# Patient Record
Sex: Female | Born: 1955 | Race: White | Hispanic: Yes | Smoking: Never smoker
Health system: Southern US, Community
[De-identification: ages and names within clinical notes are randomized; demographics above are authoritative.]

## PROBLEM LIST (undated history)

## (undated) DIAGNOSIS — F419 Anxiety disorder, unspecified: Secondary | ICD-10-CM

## (undated) DIAGNOSIS — H269 Unspecified cataract: Secondary | ICD-10-CM

## (undated) DIAGNOSIS — I10 Essential (primary) hypertension: Secondary | ICD-10-CM

## (undated) DIAGNOSIS — M1990 Unspecified osteoarthritis, unspecified site: Secondary | ICD-10-CM

## (undated) DIAGNOSIS — F329 Major depressive disorder, single episode, unspecified: Secondary | ICD-10-CM

## (undated) DIAGNOSIS — E785 Hyperlipidemia, unspecified: Secondary | ICD-10-CM

## (undated) DIAGNOSIS — F32A Depression, unspecified: Secondary | ICD-10-CM

## (undated) DIAGNOSIS — K219 Gastro-esophageal reflux disease without esophagitis: Secondary | ICD-10-CM

## (undated) DIAGNOSIS — G4730 Sleep apnea, unspecified: Secondary | ICD-10-CM

## (undated) DIAGNOSIS — IMO0002 Reserved for concepts with insufficient information to code with codable children: Secondary | ICD-10-CM

## (undated) HISTORY — DX: Unspecified osteoarthritis, unspecified site: M19.90

## (undated) HISTORY — DX: Reserved for concepts with insufficient information to code with codable children: IMO0002

## (undated) HISTORY — DX: Hyperlipidemia, unspecified: E78.5

## (undated) HISTORY — PX: ABLATION SAPHENOUS VEIN W/ RFA: SUR11

## (undated) HISTORY — DX: Major depressive disorder, single episode, unspecified: F32.9

## (undated) HISTORY — DX: Sleep apnea, unspecified: G47.30

## (undated) HISTORY — DX: Unspecified cataract: H26.9

## (undated) HISTORY — DX: Anxiety disorder, unspecified: F41.9

## (undated) HISTORY — DX: Depression, unspecified: F32.A

## (undated) HISTORY — PX: COLONOSCOPY: SHX174

## (undated) HISTORY — DX: Essential (primary) hypertension: I10

## (undated) HISTORY — PX: JOINT REPLACEMENT: SHX530

## (undated) HISTORY — DX: Gastro-esophageal reflux disease without esophagitis: K21.9

## (undated) HISTORY — DX: Morbid (severe) obesity due to excess calories: E66.01

---

## 2006-03-23 ENCOUNTER — Ambulatory Visit: Payer: Self-pay | Admitting: Family Medicine

## 2006-05-08 ENCOUNTER — Ambulatory Visit: Payer: Self-pay | Admitting: Internal Medicine

## 2006-05-09 ENCOUNTER — Ambulatory Visit (HOSPITAL_COMMUNITY): Admission: RE | Admit: 2006-05-09 | Discharge: 2006-05-09 | Payer: Self-pay | Admitting: Internal Medicine

## 2006-06-01 ENCOUNTER — Ambulatory Visit: Payer: Self-pay | Admitting: Internal Medicine

## 2006-06-22 ENCOUNTER — Ambulatory Visit: Payer: Self-pay | Admitting: Internal Medicine

## 2007-02-27 ENCOUNTER — Ambulatory Visit: Payer: Self-pay | Admitting: Internal Medicine

## 2007-10-20 ENCOUNTER — Emergency Department (HOSPITAL_COMMUNITY): Admission: EM | Admit: 2007-10-20 | Discharge: 2007-10-20 | Payer: Self-pay | Admitting: Emergency Medicine

## 2008-02-06 ENCOUNTER — Ambulatory Visit: Payer: Self-pay | Admitting: Internal Medicine

## 2008-11-23 ENCOUNTER — Observation Stay (HOSPITAL_COMMUNITY): Admission: EM | Admit: 2008-11-23 | Discharge: 2008-11-23 | Payer: Self-pay | Admitting: Emergency Medicine

## 2008-11-23 ENCOUNTER — Ambulatory Visit: Payer: Self-pay | Admitting: Vascular Surgery

## 2008-11-23 ENCOUNTER — Encounter (INDEPENDENT_AMBULATORY_CARE_PROVIDER_SITE_OTHER): Payer: Self-pay | Admitting: Emergency Medicine

## 2009-06-16 ENCOUNTER — Ambulatory Visit: Payer: Self-pay | Admitting: Vascular Surgery

## 2009-07-07 ENCOUNTER — Ambulatory Visit: Payer: Self-pay | Admitting: Vascular Surgery

## 2009-07-28 ENCOUNTER — Ambulatory Visit: Payer: Self-pay | Admitting: Vascular Surgery

## 2009-11-18 ENCOUNTER — Emergency Department (HOSPITAL_COMMUNITY): Admission: EM | Admit: 2009-11-18 | Discharge: 2009-11-18 | Payer: Self-pay | Admitting: Family Medicine

## 2010-07-26 ENCOUNTER — Encounter: Payer: Self-pay | Admitting: Internal Medicine

## 2010-08-02 ENCOUNTER — Encounter: Payer: Self-pay | Admitting: Internal Medicine

## 2010-08-03 ENCOUNTER — Ambulatory Visit (INDEPENDENT_AMBULATORY_CARE_PROVIDER_SITE_OTHER): Payer: Medicaid Other | Admitting: Internal Medicine

## 2010-08-03 ENCOUNTER — Encounter: Payer: Self-pay | Admitting: Internal Medicine

## 2010-08-03 ENCOUNTER — Encounter: Payer: Self-pay | Admitting: *Deleted

## 2010-08-03 DIAGNOSIS — G4730 Sleep apnea, unspecified: Secondary | ICD-10-CM | POA: Insufficient documentation

## 2010-08-03 DIAGNOSIS — G4733 Obstructive sleep apnea (adult) (pediatric): Secondary | ICD-10-CM | POA: Insufficient documentation

## 2010-08-03 DIAGNOSIS — R609 Edema, unspecified: Secondary | ICD-10-CM | POA: Insufficient documentation

## 2010-08-03 DIAGNOSIS — R9389 Abnormal findings on diagnostic imaging of other specified body structures: Secondary | ICD-10-CM

## 2010-08-03 DIAGNOSIS — R931 Abnormal findings on diagnostic imaging of heart and coronary circulation: Secondary | ICD-10-CM

## 2010-08-03 NOTE — Assessment & Plan Note (Signed)
The patient was referred because of mild pulmonary hypertension. There is no significant evidence of right ventricular strain a physical examination. There is left ventricular hypertrophy. She also has evidence of sleep disordered breathing and headaches and daytime somnolence. I suspect she has obstructive sleep apnea and she would benefit from a sleep study.

## 2010-08-03 NOTE — Assessment & Plan Note (Signed)
She has hypertension sleep disorder breathing left ventricular hypertrophy and headaches. It is certainly highly likely such with her body habitus that she has obstructive sleep apnea and sleep test would be appropriate.

## 2010-08-03 NOTE — Progress Notes (Signed)
HPI:[name] is seen at the request of Dr. Sedalia Muta because of an abnormal echo demonstrating mild pulmonary hypertension. The echo also revealed a large left ventricular dimension.  She has a history of chronic dyspnea on exertion and some lower extremity and upper extremity swelling. She went to see her primary physician because of "swelling in her head". The echo was obtained the aforementioned results noted in the referral made. There has been no change in her dyspnea. She denies chest discomfort. She does not have hypertension or diabetes.   .  There has been no syncope, But there has been some orthostatic intolerance. She has not had any palpitations.  She underwent echo that demonstrated mild pulmonary hypertension with an estimated pressure of 40. Also noted to have a large left ventricular end-diastolic dimension of 72; no regurgitant lesions are noted.   She has been taking meloxicame for about one year.There has been more swelling since having been initiated on gabapentin  Current Outpatient Prescriptions  Medication Sig Dispense Refill  . cyclobenzaprine (FLEXERIL) 10 MG tablet Take 10 mg by mouth 3 (three) times daily as needed.        . DULoxetine (CYMBALTA) 30 MG capsule Take 60 mg by mouth daily.        Marland Kitchen gabapentin (NEURONTIN) 300 MG capsule Take 900 mg by mouth at bedtime.        . meloxicam (MOBIC) 15 MG tablet Take 15 mg by mouth daily.        Marland Kitchen oxyCODONE-acetaminophen (PERCOCET) 5-325 MG per tablet Take 1 tablet by mouth 2 (two) times daily as needed.          No Known Allergies  Past Medical History  Diagnosis Date  . Hypertension   . Anxiety   . Depression   . Degenerative disc disease     No past surgical history on file.  Family History  Problem Relation Age of Onset  . Diabetes Mother   . Cancer Father     History   Social History  . Marital Status: Legally Separated    Spouse Name: N/A    Number of Children: 2  . Years of Education: N/A    Occupational History  . homemaker    Social History Main Topics  . Smoking status: Never Smoker   . Smokeless tobacco: Never Used  . Alcohol Use: Yes  . Drug Use: No  . Sexually Active: Not on file   Other Topics Concern  . Not on file   Social History Narrative  . No narrative on file    Fourteen point review of systems was negative except as noted in HPI and PMH   PHYSICAL EXAMINATION  There were no vitals taken for this visit.   Well developed and nourished Middle-aged Hispanic female moderately obese appearing her stated age in no acute distress HENT normal Neck supple with JVP-flat Carotids brisk and full without bruits Back without kyphosis and scoliosis Clear Regular rate and rhythm, no murmurs or gallops; no RV heave Abd-soft with active BS without hepatomegaly or midline pulsation Femoral pulses 2+ distal pulses intact No Clubbing cyanosis edema Skin-warm and dry LN-neg submandibular and supraclavicular A & Oriented CN 3-12 normal  Grossly normal sensory and motor function Affect sad and flat.  Elective cardiogram dated today demonstrates sinus rhythm at 75 Intervals 0.17 /.09/0.37 Axis is 11 Otherwise normal

## 2010-08-03 NOTE — Assessment & Plan Note (Signed)
There is no significant pitting edema. I don't understand the idea of edema of the scalp arms and lower extremities apart from the issue of hypoalbuminemia and possibly urine loss of protein. I assume that this has been evaluated by Dr. Sedalia Muta

## 2010-08-05 ENCOUNTER — Telehealth: Payer: Self-pay | Admitting: *Deleted

## 2010-08-07 LAB — POCT URINALYSIS DIP (DEVICE)
Bilirubin Urine: NEGATIVE
Glucose, UA: NEGATIVE mg/dL
Ketones, ur: NEGATIVE mg/dL
Protein, ur: NEGATIVE mg/dL
Specific Gravity, Urine: 1.02 (ref 1.005–1.030)

## 2010-08-09 NOTE — Consult Note (Signed)
Summary: Consultation Report  Consultation Report   Imported By: Earl Many 08/02/2010 18:41:27  _____________________________________________________________________  External Attachment:    Type:   Image     Comment:   External Document

## 2010-08-09 NOTE — Progress Notes (Signed)
  Phone Note Outgoing Call   Call placed by: Scherrie Bateman, LPN,  August 05, 2010 3:17 PM Call placed to: Patient Summary of Call: PT DROPPED ECHO CD  AND HARD COPY AS WELL OFF  AT OFFICE TODAY  .LEFT WORD THAT PT MAY PICK TEST REPORTS WILL LEAVE AT FRONT DESK COPY MADE OF ECHO EPORT TO BE SCANNED INTO PT'S RECORDS  Initial call taken by: Scherrie Bateman, LPN,  August 05, 2010 3:19 PM

## 2010-08-09 NOTE — Assessment & Plan Note (Signed)
Summary: consult : abn ekg. per [facility] [phone]. [email or web address]   Visit Type:  Initial Consult Primary Provider:  Gerre Pebbles, MD   History of Present Illness: see epic  Current Medications (verified): 1)  Cymbalta 30 Mg Cpep (Duloxetine Hcl) .... 2 Caps Once Daily 2)  Meloxicam 15 Mg Tabs (Meloxicam) .... Daily 3)  Gabapentin 300 Mg Caps (Gabapentin) .... 3 At Bedtime 4)  Cyclobenzaprine Hcl 10 Mg Tabs (Cyclobenzaprine Hcl) .... As Needed 5)  Oxycodone-Acetaminophen 5-325 Mg Tabs (Oxycodone-Acetaminophen) .Marland Kitchen.. 1 Tab Two Times A Day  Allergies (verified): No Known Drug Allergies  Past History:  Past Medical History: HYPERLIPIDEMIA DEPRESSION   ECHOCARDIOGRAM, ABNORMAL (ICD-793.2)    Vital Signs:  Patient profile:   55 year old female Height:      68 inches Weight:      288 pounds BMI:     43.95 Pulse rate:   75 / minute BP sitting:   118 / 78  (left arm)  Vitals Entered By: Laurance Flatten CMA (August 03, 2010 8:54 AM)   Other Orders: EKG w/ Interpretation (93000)  Patient Instructions: 1)  Your physician recommends that you schedule a follow-up appointment in: as needed.

## 2010-08-09 NOTE — Procedures (Signed)
Summary: [facility]: Cardiopulmonary [facility]: Cardiopulmonary Services   Imported By: Earl Many 08/02/2010 18:39:56  _____________________________________________________________________  External Attachment:    Type:   Image     Comment:   External Document

## 2010-08-15 ENCOUNTER — Telehealth: Payer: Self-pay | Admitting: Internal Medicine

## 2010-08-15 DIAGNOSIS — R9389 Abnormal findings on diagnostic imaging of other specified body structures: Secondary | ICD-10-CM

## 2010-08-15 NOTE — Telephone Encounter (Signed)
DAUGHTER AWARE WILL DISCUSS  WITH DR Graciela Husbands IF PT NEEDS  TO HAVE A REPEAT ECHO  DUE TO ECHO AT [place] READ AS ABN.

## 2010-08-15 NOTE — Telephone Encounter (Signed)
Pt daughter states they left paper of test for the doctor to review. Pt states they have not heard from the doctor and it has been over a week

## 2010-08-16 NOTE — Telephone Encounter (Signed)
Pt's daughter aware per dr Graciela Husbands repeat echo .Echo scheduled for 08/30/10 at 7:30 am .

## 2010-08-16 NOTE — Telephone Encounter (Signed)
probobaly just best to repeat the echo and make sure it s okay

## 2010-08-24 ENCOUNTER — Encounter: Payer: Self-pay | Admitting: Internal Medicine

## 2010-08-28 LAB — POCT I-STAT, CHEM 8
Chloride: 104 mEq/L (ref 96–112)
Potassium: 3.9 mEq/L (ref 3.5–5.1)
Sodium: 141 mEq/L (ref 135–145)
TCO2: 24 mmol/L (ref 0–100)

## 2010-08-28 LAB — CBC
MCV: 90.9 fL (ref 78.0–100.0)
Platelets: 175 10*3/uL (ref 150–400)
RBC: 4.09 MIL/uL (ref 3.87–5.11)
RDW: 14 % (ref 11.5–15.5)
WBC: 6.8 10*3/uL (ref 4.0–10.5)

## 2010-08-28 LAB — DIFFERENTIAL
Basophils Absolute: 0.1 10*3/uL (ref 0.0–0.1)
Eosinophils Absolute: 0.2 10*3/uL (ref 0.0–0.7)
Neutro Abs: 3.6 10*3/uL (ref 1.7–7.7)

## 2010-08-28 LAB — D-DIMER, QUANTITATIVE: D-Dimer, Quant: 1.53 ug/mL-FEU — ABNORMAL HIGH (ref 0.00–0.48)

## 2010-08-30 ENCOUNTER — Ambulatory Visit (HOSPITAL_COMMUNITY): Payer: Medicaid Other | Attending: Internal Medicine

## 2010-08-30 DIAGNOSIS — I379 Nonrheumatic pulmonary valve disorder, unspecified: Secondary | ICD-10-CM | POA: Insufficient documentation

## 2010-08-30 DIAGNOSIS — I10 Essential (primary) hypertension: Secondary | ICD-10-CM | POA: Insufficient documentation

## 2010-08-30 DIAGNOSIS — I059 Rheumatic mitral valve disease, unspecified: Secondary | ICD-10-CM | POA: Insufficient documentation

## 2010-08-30 DIAGNOSIS — R9389 Abnormal findings on diagnostic imaging of other specified body structures: Secondary | ICD-10-CM | POA: Insufficient documentation

## 2010-08-30 DIAGNOSIS — I079 Rheumatic tricuspid valve disease, unspecified: Secondary | ICD-10-CM | POA: Insufficient documentation

## 2010-09-09 ENCOUNTER — Other Ambulatory Visit: Payer: Self-pay | Admitting: Neurological Surgery

## 2010-09-09 DIAGNOSIS — M545 Low back pain: Secondary | ICD-10-CM

## 2010-09-14 ENCOUNTER — Encounter: Payer: Self-pay | Admitting: Internal Medicine

## 2010-09-14 ENCOUNTER — Ambulatory Visit
Admission: RE | Admit: 2010-09-14 | Discharge: 2010-09-14 | Disposition: A | Discharge status: Home / Self Care | Payer: Medicaid Other | Source: Ambulatory Visit | Attending: Neurological Surgery | Admitting: Neurological Surgery

## 2010-09-14 DIAGNOSIS — M545 Low back pain: Secondary | ICD-10-CM

## 2010-09-19 ENCOUNTER — Encounter: Payer: Self-pay | Admitting: Internal Medicine

## 2010-10-04 ENCOUNTER — Ambulatory Visit: Payer: Medicaid Other | Admitting: Rehabilitative and Restorative Service Providers"

## 2010-10-04 NOTE — Consult Note (Signed)
NEW PATIENT CONSULTATION   Linda Acevedo, Linda Acevedo  DOB:  [birth year]                                       06/16/2009  CHART#:[id number]   The patient presents today for evaluation of lower extremity venous  pathology.  She is a very pleasant 55 year old Hispanic female with a  longstanding history of venous varicosities.  She reports that she had  bilateral great saphenous vein stripping and tributary phlebectomy  greater than 20 years.  She had a relatively early recurrence of  tributary varicosities.  Over the past several months starting back in  July 2010 she had multiple episodes of superficial thrombophlebitis  involving the calves and thighs bilaterally.  She was not admitted to  the hospital but was seen in the emergency department and kept for  observation and repeated ultrasound studies.  She did not have a history  of DVT during  these occasions.  She continues to have discomfort  related to this.  She has been in graduated compression garments since  July and has continued to have persistent pain over the tributary  varicosities and also has had recurrent episodes of superficial  thrombophlebitis as well.  She is quite concerned regarding the  potential for DVT.   PAST MEDICAL HISTORY:  Significant for depression and anxiety.  She also  has history of osteoarthritis with shoulder and bilateral lower  extremity discomfort related to this as well.  She speaks very poor  English but is here today with her daughter who is fluent in [place] and  [place] and provides most of the details.  She does not have history of  diabetes, hypertension or prior cardiac difficulty.  She denies any  family history or premature atherosclerotic disease.   SOCIAL HISTORY:  She has 2 children.  She does not smoke or drink  alcohol.   REVIEW OF SYSTEMS:  Her weight is reported at 270 pounds.  She is 5 feet  11 inches tall.  She denies cardiac, pulmonary, GI or GU difficulty.  She does report pain in her legs with walking and lying flat.  NEUROLOGIC:  Positive for dizziness.  MUSCULOSKELETAL:  Positive for arthritis and joint pain.  HEENT:  Negative.  HEMATOLOGIC:  Negative.  SKIN:  Negative.  PSYCHIATRIC:  Positive for depression and anxiety.   PHYSICAL EXAMINATION:  A well-developed, moderately obese female in no  acute distress.  Her heart rate is 80, respirations 16, blood pressure  is 118/80.  HEENT:  Normal.  Neck:  No adenopathy.  No JVD.  Her radial  and dorsalis pedis pulses are 2+ bilaterally.  Musculoskeletal:  No  major deformities or cyanosis.  Neurologic:  Negative for weakness.  She  has no ulcers or rashes.  She does have diffuse telangiectasia in the  bilateral lower extremities and does have scars from prior ankle  incisions and also phlebectomy sites in her calf.  She does have a  moderate amount of recurrent tributary varicosities in her calves and  popliteal spaces bilaterally.   Her duplex shows surgical absence of her great saphenous veins  bilaterally.  She does have a great deal of tortuous recurrence of  varicosities in her calves and thighs bilaterally.  I discussed options  with the patient.  I explained that should not put her at any  significant risk for DVT and that if she does  have recurrent episodes of  superficial thrombophlebitis, this would be treated with time and  ibuprofen.  She reports that she is having significant pain over the  varicosities despite compression garment use.  I  feel that she should  have a reasonable response with tributary phlebectomy.  I explained that  this should also reduce her risk for recurrent thrombophlebitis since  these veins will be surgically absent.  She understands and wishes to  proceed as soon as possible.     Larina Earthly, M.D.  Electronically Signed   TFE/MEDQ  D:  06/16/2009  T:  06/17/2009  Job:  [id number]   cc:   Fidela Juneau, PA-C

## 2010-10-04 NOTE — Assessment & Plan Note (Signed)
OFFICE VISIT   Linda Acevedo, Linda Acevedo  DOB:  1956-03-10                                       07/28/2009  CHART#:[id number]   The patient presents today for followup of her stab phlebectomy of her  bilateral superficial varicosities.  This was on 07/07/2009.  She has  done quite well.  She is here today with her daughter for translation.  She has had no complications and has minimal discomfort.  Her stab  incisions are all healing quite nicely and she has the usual amount of  thickness below these and I explained that this will continue to resolve  with time.  She will see [place] again on an as-needed basis.     Larina Earthly, M.D.  Electronically Signed   TFE/MEDQ  D:  07/28/2009  T:  07/29/2009  Job:  [id number]

## 2010-10-04 NOTE — Procedures (Signed)
LOWER EXTREMITY VENOUS REFLUX EXAM   INDICATION:  Bilateral lower extremity varicose veins with pain and  swelling with a history of venous procedures bilaterally over 20 years  ago.   EXAM:  Using color-flow imaging and pulse Doppler spectral analysis, the  right and left common femoral, superficial femoral, popliteal, posterior  tibial, greater and lesser saphenous veins are evaluated.  There is  evidence suggesting deep venous insufficiency in the right and left  lower extremities.   The right and left greater saphenous veins appear to have been stripped.   The right and left short saphenous veins appear to have had procedures  done to them as well.   GSV Diameter (used if found to be incompetent only)                                            Right    Left  Proximal Greater Saphenous Vein           cm       cm  Proximal-to-mid-thigh                     cm       cm  Mid thigh                                 cm       cm  Mid-distal thigh                          cm       cm  Distal thigh                              cm       cm  Knee                                      cm       cm   IMPRESSION:  1. Right and left greater saphenous veins appear to have been      stripped.  2. The deep venous system is not competent with reflux of >500      milliseconds.  3. The right and left lesser saphenous veins appear to have had      procedures to them as well.  4. Bilateral tortuous varicose veins medial / posterior throughout      lower extremities with superficial venous thrombosis near the      knees.   ___________________________________________  Larina Earthly, M.D.   AS/MEDQ  D:  06/16/2009  T:  06/16/2009  Job:  [id number]

## 2010-10-04 NOTE — Assessment & Plan Note (Signed)
OFFICE VISIT   ATALAYA, ZAPPIA  DOB:  Jun 10, 1955                                       07/07/2009  CHART#:[id number]   The patient underwent uneventful bilateral stab phlebectomy of multiple  tributary varicosities on the posterior calves and distal thighs  bilaterally under tumescent anesthesia.  She had no immediate  complications, was placed in a compression dressing and will be seen  again in 2 weeks for followup.     Larina Earthly, M.D.  Electronically Signed   TFE/MEDQ  D:  07/07/2009  T:  07/08/2009  Job:  [id number]

## 2010-10-26 ENCOUNTER — Ambulatory Visit: Payer: Medicaid Other | Attending: Neurological Surgery | Admitting: Physical Therapy

## 2010-10-26 DIAGNOSIS — IMO0001 Reserved for inherently not codable concepts without codable children: Secondary | ICD-10-CM | POA: Insufficient documentation

## 2010-10-26 DIAGNOSIS — M545 Low back pain, unspecified: Secondary | ICD-10-CM | POA: Insufficient documentation

## 2010-10-26 DIAGNOSIS — R5381 Other malaise: Secondary | ICD-10-CM | POA: Insufficient documentation

## 2010-10-26 DIAGNOSIS — M25659 Stiffness of unspecified hip, not elsewhere classified: Secondary | ICD-10-CM | POA: Insufficient documentation

## 2010-10-26 DIAGNOSIS — R293 Abnormal posture: Secondary | ICD-10-CM | POA: Insufficient documentation

## 2010-11-02 ENCOUNTER — Encounter: Payer: Self-pay | Admitting: Internal Medicine

## 2010-11-08 ENCOUNTER — Ambulatory Visit: Payer: Medicaid Other | Admitting: Physical Therapy

## 2010-11-15 ENCOUNTER — Ambulatory Visit: Payer: Medicaid Other | Admitting: Rehabilitation

## 2011-02-15 LAB — POCT I-STAT, CHEM 8
BUN: 11
Calcium, Ion: 1.16
Chloride: 107
Creatinine, Ser: 0.8
Glucose, Bld: 121 — ABNORMAL HIGH
Potassium: 6.3
Sodium: 139
TCO2: 29

## 2011-02-15 LAB — POTASSIUM: Potassium: 3.9

## 2017-02-13 DIAGNOSIS — I2699 Other pulmonary embolism without acute cor pulmonale: Secondary | ICD-10-CM | POA: Insufficient documentation

## 2017-02-27 ENCOUNTER — Other Ambulatory Visit: Payer: Self-pay | Admitting: *Deleted

## 2017-03-20 ENCOUNTER — Encounter: Payer: Self-pay | Admitting: Thoracic Surgery (Cardiothoracic Vascular Surgery)

## 2017-03-20 ENCOUNTER — Institutional Professional Consult (permissible substitution) (INDEPENDENT_AMBULATORY_CARE_PROVIDER_SITE_OTHER): Payer: Medicaid Other | Admitting: Thoracic Surgery (Cardiothoracic Vascular Surgery)

## 2017-03-20 VITALS — BP 119/84 | HR 68 | Ht 66.0 in | Wt 266.0 lb

## 2017-03-20 DIAGNOSIS — I77810 Thoracic aortic ectasia: Secondary | ICD-10-CM

## 2017-03-20 DIAGNOSIS — M179 Osteoarthritis of knee, unspecified: Secondary | ICD-10-CM | POA: Insufficient documentation

## 2017-03-20 DIAGNOSIS — Z6841 Body Mass Index (BMI) 40.0 and over, adult: Secondary | ICD-10-CM | POA: Insufficient documentation

## 2017-03-20 DIAGNOSIS — M1710 Unilateral primary osteoarthritis, unspecified knee: Secondary | ICD-10-CM | POA: Insufficient documentation

## 2017-03-20 NOTE — Progress Notes (Signed)
PCP is No primary care provider on file. Referring Provider is Adron Bene, PA-C  Chief Complaint  Patient presents with  . New Patient (Initial Visit)    thoracic aortic aneurysm, 4.0, CT Chest    HPI: [name] is sent for consultation regarding a 4 cm ascending aorta  [name] is a 61 year old woman with a history of hypertension, degenerative joint disease, anxiety and depression.  She also is morbidly obese.  She had a knee replacement back in June.  She says she never really felt right after that.  She was having chest pain and shortness of breath and went to see her primary Adron Bene in September.  She was sent to the hospital and had a CT angiogram which showed multiple right upper lobe pulmonary emboli and she was started on apixaban.  Also noted on CT was a 4.0 cm ascending aorta.  She has no family history of aneurysmal disease.  Past Medical History:  Diagnosis Date  . Anxiety   . Degenerative disc disease   . Depression   . Hypertension     Past Surgical History:  Procedure Laterality Date  . ABLATION SAPHENOUS VEIN W/ RFA    . JOINT REPLACEMENT     knee    Family History  Problem Relation Age of Onset  . Diabetes Mother   . Cancer Father     Social History Social History  Substance Use Topics  . Smoking status: Never Smoker  . Smokeless tobacco: Never Used  . Alcohol use Yes    Current Outpatient Prescriptions  Medication Sig Dispense Refill  . apixaban (ELIQUIS) 5 MG TABS tablet Take 5 mg by mouth 2 (two) times daily.    . DULoxetine (CYMBALTA) 30 MG capsule Take 60 mg by mouth daily.      Marland Kitchen oxyCODONE-acetaminophen (PERCOCET) 5-325 MG per tablet Take 1 tablet by mouth 2 (two) times daily as needed.       No current facility-administered medications for this visit.     Allergies  Allergen Reactions  . Gabapentin Swelling    Face swelling    Review of Systems  Constitutional: Positive for fatigue. Negative for activity  change.  HENT: Negative for trouble swallowing and voice change.   Eyes: Negative for visual disturbance.  Respiratory: Positive for shortness of breath.   Cardiovascular: Positive for chest pain and leg swelling (Varicose veins).  Gastrointestinal: Negative for abdominal distention and abdominal pain.  Genitourinary: Negative for dysuria and hematuria.  Musculoskeletal: Positive for arthralgias, back pain and gait problem.  Neurological: Negative for syncope and weakness.  Hematological: Negative for adenopathy. Does not bruise/bleed easily.  Psychiatric/Behavioral: Positive for dysphoric mood. The patient is nervous/anxious.   All other systems reviewed and are negative.   BP 119/84   Pulse 68   Ht 5\' 6"  (1.676 m)   Wt 266 lb (120.7 kg)   SpO2 99%   BMI 42.93 kg/m  Physical Exam  Constitutional: She is oriented to person, place, and time.  Morbidly obese  HENT:  Head: Normocephalic and atraumatic.  Mouth/Throat: No oropharyngeal exudate.  Eyes: Conjunctivae and EOM are normal. No scleral icterus.  Neck: Neck supple. No thyromegaly present.  No carotid bruits  Cardiovascular: Normal rate, regular rhythm and normal heart sounds.   No murmur heard. Pulmonary/Chest: Effort normal and breath sounds normal. No respiratory distress. She has no wheezes. She has no rales.  Abdominal: Soft. She exhibits no distension. There is no tenderness.  Musculoskeletal: She  exhibits edema.  Lymphadenopathy:    She has no cervical adenopathy.  Neurological: She is alert and oriented to person, place, and time. No cranial nerve deficit. She exhibits normal muscle tone.  Skin: Skin is warm and dry.  Vitals reviewed.    Diagnostic Tests: I personally reviewed the CT chest images from 02/13/2017.  I concur with the official impression which is dictated below.  Cardiovascular: There are multiple lower lobe pulmonary emboli bilaterally somewhat more on the right than the left.  Main pulmonary  artery embolus is evident.  There is pulmonary embolus in several branches to the right upper lobe pulmonary artery.  The right ventricle to left ventricle diameter ratio is less than 0.9, not consistent with right heart strain. The ascending thoracic aorta diameter is 4.0 x 4.0 cm.  There is no aortic dissection.  Visualized great vessels appear unremarkable.  Pericardium is not appreciably thickened. The main pulmonary outflow tract measures 3.4 cm, finding felt to be indicative of a degree of underlying pulmonary arterial hypertension. Mediastinum/nodes: thyroid appears normal.  There is no appreciable thoracic adenopathy.  No esophageal lesions are evident Lungs/pleura: There is patchy atelectasis in both lower lobes.  There is no edema or consolidation.  There is no appreciable pleural effusion or pleural thickening.  Impression: [name] is a 61 year old woman who was recently found to have a 4 cm ascending aorta on the CT angiogram that was done to evaluate for pulmonary embolus.  Pulmonary embolus-2 months post knee replacement.  She is being treated with apixaban and her symptoms have improved since she started treatment.  Ascending aorta-mildly dilated.  No indication for surgery.  Indications for surgery with the diameter of 5.5 cm or an increase in size of 5 mm and a 6-month period.  She is a large woman and this is just slightly above the upper limits of normal given her age and size.  She needs annual follow-up  Blood pressure-blood pressure control is the mainstay of treatment.  Her blood pressure was normal today.  She says that she has a blood pressure cuff at home.  I recommend that she check herself at least monthly with a target systolic pressure of less than 130.  Plan: Return in 1 year with CT angiogram of chest  Melrose Nakayama, MD [facility] [phone]

## 2017-08-08 ENCOUNTER — Encounter: Payer: Self-pay | Admitting: Gastroenterology

## 2018-01-30 ENCOUNTER — Other Ambulatory Visit: Payer: Self-pay | Admitting: Thoracic Surgery (Cardiothoracic Vascular Surgery)

## 2018-01-30 DIAGNOSIS — I712 Thoracic aortic aneurysm, without rupture, unspecified: Secondary | ICD-10-CM

## 2018-03-19 ENCOUNTER — Other Ambulatory Visit: Payer: Self-pay

## 2018-03-19 ENCOUNTER — Ambulatory Visit: Payer: Medicaid Other | Admitting: Thoracic Surgery (Cardiothoracic Vascular Surgery)

## 2018-03-19 ENCOUNTER — Encounter: Payer: Self-pay | Admitting: Thoracic Surgery (Cardiothoracic Vascular Surgery)

## 2018-03-19 ENCOUNTER — Ambulatory Visit
Admission: RE | Admit: 2018-03-19 | Discharge: 2018-03-19 | Disposition: A | Discharge status: Home / Self Care | Payer: Medicaid Other | Source: Ambulatory Visit | Attending: Thoracic Surgery (Cardiothoracic Vascular Surgery) | Admitting: Thoracic Surgery (Cardiothoracic Vascular Surgery)

## 2018-03-19 VITALS — BP 115/79 | HR 72 | Resp 16 | Ht 66.0 in | Wt 273.6 lb

## 2018-03-19 DIAGNOSIS — I77810 Thoracic aortic ectasia: Secondary | ICD-10-CM | POA: Diagnosis not present

## 2018-03-19 DIAGNOSIS — I712 Thoracic aortic aneurysm, without rupture, unspecified: Secondary | ICD-10-CM

## 2018-03-19 MED ORDER — IOPAMIDOL (ISOVUE-370) INJECTION 76%
75.0000 mL | Freq: Once | INTRAVENOUS | Status: AC | PRN
  Administered 2018-03-19: 75 mL via INTRAVENOUS

## 2018-03-19 NOTE — Progress Notes (Signed)
Linda Acevedo       [place] [address]             [phone]     HPI: Linda Acevedo returns for a scheduled one-year follow-up  Linda Acevedo is a 62 year old woman with a history of hypertension, degenerative joint disease, anxiety, depression, pulmonary embolus, and an ascending aneurysm.  She had a knee replacement in June 2018.  She developed chest pain and shortness of breath couple months later.  A CT of the chest showed pulmonary emboli and a 4 cm ascending aorta.  She was treated with apixaban.  I saw her in October.  She now returns for a one year follow-up visit.  She feels well.  She is not having any chest pain or shortness of breath.  Past Medical History:  Diagnosis Date  . Anxiety   . Degenerative disc disease   . Depression   . Hypertension   . Morbid obesity ([place])     Current Outpatient Medications  Medication Sig Dispense Refill  . DULoxetine (CYMBALTA) 30 MG capsule Take 60 mg by mouth daily.      . furosemide (LASIX) 20 MG tablet Take 20 mg by mouth daily.    . meloxicam (MOBIC) 15 MG tablet Take 15 mg by mouth daily.    Marland Kitchen OMEPRAZOLE PO Take 20 mg by mouth as needed.    Marland Kitchen oxyCODONE-acetaminophen (PERCOCET) 5-325 MG per tablet Take 1 tablet by mouth 2 (two) times daily as needed.      . pravastatin (PRAVACHOL) 20 MG tablet Take 20 mg by mouth daily.     No current facility-administered medications for this visit.     Physical Exam BP 115/79 (BP Location: Right Arm, Patient Position: Sitting, Cuff Size: Large)   Pulse 72   Resp 16   Ht 5\' 6"  (1.676 m)   Wt 273 lb 9.6 oz (124.1 kg)   SpO2 96% Comment: RA  BMI 44.47 kg/m  62 year old woman in no acute distress Obese No carotid bruits Cardiac regular rate and rhythm normal S1-S2 Lungs clear bilaterally No peripheral edema  Diagnostic Tests: CT ANGIOGRAPHY CHEST WITH CONTRAST  TECHNIQUE: Multidetector CT imaging of the chest was performed using the standard  protocol during bolus administration of intravenous contrast. Multiplanar CT image reconstructions and MIPs were obtained to evaluate the vascular anatomy.  CONTRAST:  8mL ISOVUE-370 IOPAMIDOL (ISOVUE-370) INJECTION 76%  COMPARISON:  Chest CT - 02/13/2017  Creatinine was obtained on site at [facility] at [address]  Results: Creatinine 0.8 mg/dL.  FINDINGS: Vascular Findings:  There is unchanged mild fusiform aneurysmal dilatation of the ascending thoracic aorta with measurements as follows.  The thoracic aorta tapers to a normal caliber at the level of the aortic arch. No evidence of thoracic aortic dissection or periaortic stranding on this nongated examination.  Bovine configuration of the aortic arch. The branch vessels of the aortic arch appear widely patent throughout their imaged course. Note is made of an aortic nipple.  Normal heart size.  No pericardial effusion.  Although this examination was not tailored for the evaluation the pulmonary arteries, there are no discrete filling defects within the central pulmonary arterial tree to suggest acute or chronic central pulmonary embolism. Enlarged caliber of the main pulmonary artery measuring 3.4 cm in diameter.  -------------------------------------------------------------  Thoracic aortic measurements:  Sinotubular junction  36 mm as measured in greatest oblique coronal dimension.  Proximal ascending aorta  41  mm as measured in greatest oblique axial dimension at the level of the main pulmonary artery (axial image 61, series 6) and approximately 39 mm in greatest oblique short axis coronal diameter (coronal image 69, series 11), unchanged compared to the 02/13/2017 examination, previously, 42 and 41 mm in dimensions respectively.  Aortic arch aorta  32 mm as measured in greatest oblique sagittal dimension.  Proximal descending thoracic aorta  29 mm as measured in  greatest oblique axial dimension at the level of the main pulmonary artery.  Distal descending thoracic aorta  25 mm as measured in greatest oblique axial dimension at the level of the diaphragmatic hiatus.  Review of the MIP images confirms the above findings.  -------------------------------------------------------------  Non-Vascular Findings:  Mediastinum/Lymph Nodes: No bulky mediastinal, hilar or axillary adenopathy.  Lungs/Pleura: Minimal dependent subpleural ground-glass atelectasis. No discrete focal airspace opacities. No pleural effusion or pneumothorax. The central pulmonary airways appear widely patent.  No discrete pulmonary nodules.  Upper abdomen: Limited early arterial phase evaluation of the upper abdomen demonstrates a suspected small hiatal hernia.  Musculoskeletal: No acute or aggressive osseous abnormalities. Stigmata of DISH throughout the thoracic spine. Mild rotatory scoliotic curvature of the thoracolumbar spine, potentially positional.  IMPRESSION: 1. Stable uncomplicated mild fusiform aneurysmal dilatation of the ascending thoracic aorta measuring 41 mm in diameter. Recommend annual imaging followup by CTA or MRA. This recommendation follows 2010 ACCF/AHA/AATS/ACR/ASA/SCA/SCAI/SIR/STS/SVM Guidelines for the Diagnosis and Management of Patients with Thoracic Aortic Disease. Circulation. 2010; 121: S827-M786 2. Aortic aneurysm NOS (ICD10-I71.9). 3. Enlarged caliber the main pulmonary artery, nonspecific though could be attributable to pulmonary arterial hypertension. Further evaluation with cardiac echo could be performed as indicated. 4. No evidence acute or chronic central pulmonary embolism.   Electronically Signed   By: Sandi Mariscal M.D.   On: 03/19/2018 12:02 I personally reviewed the CT images and concur with the findings noted above.  Impression: Linda Acevedo is a 62 year old woman who developed a DVT and had  pulmonary emboli after a knee replacement a year ago.  On her CT she had a 4 cm ascending aneurysm.  Pulmonary embolus-completed her course of apixaban.  No longer on blood thinners.  Pulmonary artery mildly enlarged but no larger than a year ago.  Ascending aneurysm-stable at 4 to 4.1 cm.  No indication for intervention.  Blood pressures well controlled.  Plan: Return in 1 year with CT Angio of chest.  Melrose Nakayama, MD [facility] [phone]

## 2018-06-11 DIAGNOSIS — Z1231 Encounter for screening mammogram for malignant neoplasm of breast: Secondary | ICD-10-CM | POA: Diagnosis not present

## 2018-06-20 DIAGNOSIS — H53122 Transient visual loss, left eye: Secondary | ICD-10-CM | POA: Diagnosis not present

## 2018-06-20 DIAGNOSIS — R51 Headache: Secondary | ICD-10-CM | POA: Diagnosis not present

## 2018-06-20 DIAGNOSIS — R5383 Other fatigue: Secondary | ICD-10-CM | POA: Diagnosis not present

## 2018-08-26 ENCOUNTER — Ambulatory Visit: Payer: Medicaid Other | Admitting: Neurology

## 2018-10-03 ENCOUNTER — Telehealth: Payer: Self-pay | Admitting: Neurology

## 2018-10-03 NOTE — Telephone Encounter (Signed)
Due to current COVID 19 pandemic, our office is severely reducing in office visits until further notice, in order to minimize the risk to our patients and healthcare providers.   Called patient to offer a virtual visit for her 5/19 appointment. I spoke with patient's daughter who states that it would be best to have an in office visit. Daughter will act as Optometrist and understands that only 1 person may accompany patient. I advised that upon arrival patient and guest must pull car next to entrance and a staff member will check temperatures and screen with COVID-19 questions and they must await further direction. Patient's daughter is aware that she will receive a call from RN to pre-screen/update chart. Best contact # is [phone] to speak with daughter.

## 2018-10-08 ENCOUNTER — Encounter: Payer: Self-pay | Admitting: Neurology

## 2018-10-08 ENCOUNTER — Ambulatory Visit (INDEPENDENT_AMBULATORY_CARE_PROVIDER_SITE_OTHER): Payer: Medicaid Other | Admitting: Neurology

## 2018-10-08 ENCOUNTER — Other Ambulatory Visit: Payer: Self-pay

## 2018-10-08 VITALS — BP 128/81 | HR 79 | Temp 96.6°F | Ht 66.0 in | Wt 282.0 lb

## 2018-10-08 DIAGNOSIS — G4489 Other headache syndrome: Secondary | ICD-10-CM | POA: Diagnosis not present

## 2018-10-08 DIAGNOSIS — R519 Headache, unspecified: Secondary | ICD-10-CM | POA: Insufficient documentation

## 2018-10-08 DIAGNOSIS — G479 Sleep disorder, unspecified: Secondary | ICD-10-CM

## 2018-10-08 HISTORY — DX: Headache, unspecified: R51.9

## 2018-10-08 MED ORDER — TOPIRAMATE 25 MG PO TABS: ORAL_TABLET | ORAL | 3 refills | Status: DC

## 2018-10-08 NOTE — Patient Instructions (Signed)
We will start Topamax for the headache.   Topamax (topiramate) is a seizure medication that has an FDA approval for seizures and for migraine headache. Potential side effects of this medication include weight loss, cognitive slowing, tingling in the fingers and toes, and carbonated drinks will taste bad. If any significant side effects are noted on this drug, please contact our office.  

## 2018-10-08 NOTE — Progress Notes (Signed)
Reason for visit: Headache  Referring physician: Dr. Andrey Campanile Linda Acevedo is a 63 y.o. female  History of present illness:  [name] is a 63 year old right-handed [place] female with a history of headaches that began about 6 months ago.  The patient has a pre-existing history of anxiety and depression, she worries about everything.  She has been on Cymbalta for her depression, she has been on this medication for at least 10 years, there have been no recent changes in dosing of the drug.  The patient is currently living with her daughter in the [place].  She has started having very frequent headaches, she is now having headaches about every other day, they are almost always preceded by nightmares.  The patient apparently snores quite a bit when she sleeps, she wakes up frequently at night.  She does report some fatigue during the day, she will sometimes take a nap during the day.  She will have severe nightmares frequently, and after the nightmare, she will wake up with a headache.  On least 1 occasion she has had some significant blurring of vision, not loss of vision.  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  The left eye is her good eye.  She had one episode of severe visual blurring, not dimming of vision or loss of vision.  She may have nausea and vomiting with a severe headache, she may also have photophobia without phonophobia.  She reports no focal numbness or weakness of the face, arms, or legs.  She does get occasional dizziness, she denies any neck pain.  She will take a Tylenol for the headache and usually the headache is gone within an hour or 2.  She does not drink a lot of caffeinated products during the daytime.  She does have some slight gait instability, she has had a right total knee replacement, she does not use a cane for ambulation, she has not had any falls or head trauma.  She did have a CT scan of the  brain that was unremarkable.  Her primary care physician ordered a sedimentation rate, I do not have the results of the study.  The patient has never had headaches previously, she did not have headaches as a young adult or adolescent.  She denies any focal numbness or weakness of the face, arms, or legs.  She denies issues controlling the bowels or the bladder.  There is a family history of headaches, the patient has several nephews and nieces with migraine.  Past Medical History:  Diagnosis Date   Anxiety    Arthritis    Degenerative disc disease    Depression    Hypertension    Morbid obesity ([place])     Past Surgical History:  Procedure Laterality Date   ABLATION SAPHENOUS VEIN W/ RFA     JOINT REPLACEMENT     knee    Family History  Problem Relation Age of Onset   Diabetes Mother    Cancer Father     Social history:  reports that she has never smoked. She has never used smokeless tobacco. She reports that she does not drink alcohol or use drugs.  Medications:  Prior to Admission medications   Medication Sig Start Date End Date Taking? Authorizing Provider  DULoxetine (CYMBALTA) 30 MG capsule Take 60 mg by mouth daily.     Yes [provider]  furosemide (LASIX) 20 MG tablet  Take 20 mg by mouth daily.   Yes [provider]  meloxicam (MOBIC) 15 MG tablet Take 15 mg by mouth daily.   Yes [provider]  OMEPRAZOLE PO Take 20 mg by mouth as needed.   Yes [provider]  oxyCODONE-acetaminophen (PERCOCET) 5-325 MG per tablet Take 1 tablet by mouth 2 (two) times daily as needed.     Yes [provider]  pravastatin (PRAVACHOL) 20 MG tablet Take 20 mg by mouth daily.   Yes [provider]      Allergies  Allergen Reactions   Gabapentin Swelling    Face swelling    ROS:  Out of a complete 14 system review of symptoms, the patient complains only of the following symptoms, and all other reviewed systems are  negative.  Anxiety, depression Headache, blurred vision, dizziness  Blood pressure 128/81, pulse 79, temperature (!) 96.6 F (35.9 C), temperature source Oral, height 5\' 6"  (1.676 m), weight 282 lb (127.9 kg).  Physical Exam  General: The patient is alert and cooperative at the time of the examination.  The patient is moderately to markedly obese.  Eyes: Pupils are equal, round, and reactive to light. Discs are flat bilaterally.  Neck: The neck is supple, no carotid bruits are noted.  Respiratory: The respiratory examination is clear.  Cardiovascular: The cardiovascular examination reveals a regular rate and rhythm, no obvious murmurs or rubs are noted.  Skin: Extremities are without significant edema.  Neurologic Exam  Mental status: The patient is alert and oriented x 3 at the time of the examination. The patient has apparent normal recent and remote memory, with an apparently normal attention span and concentration ability.  Cranial nerves: Facial symmetry is present. There is good sensation of the face to pinprick and soft touch bilaterally. The strength of the facial muscles and the muscles to head turning and shoulder shrug are normal bilaterally. Speech is well enunciated, no aphasia or dysarthria is noted. Extraocular movements are full. Visual fields are full. The tongue is midline, and the patient has symmetric elevation of the soft palate. No obvious hearing deficits are noted.  Motor: The motor testing reveals 5 over 5 strength of all 4 extremities. Good symmetric motor tone is noted throughout.  Sensory: Sensory testing is intact to pinprick, soft touch, vibration sensation, and position sense on all 4 extremities. No evidence of extinction is noted.  Coordination: Cerebellar testing reveals good finger-nose-finger and heel-to-shin bilaterally.  Gait and station: Gait is slightly wide-based, the patient can walk independently.  Tandem gait is unsteady.  Romberg is  negative. No drift is seen.  Reflexes: Deep tendon reflexes are symmetric, but slightly depressed bilaterally. Toes are downgoing bilaterally.   Assessment/Plan:  1.  Frequent headache  2.  Snoring, nightmares, frequent waking  3.  Anxiety and depression  The patient has had new onset of headaches over the last 6 months.  There is no pre-existing history of headache.  She has undergone a CT scan of the brain that is unremarkable.  The neurologic examination is relatively normal.  Given the history, I would be concerned that the patient may have a sleep disorder such as sleep apnea.  Many features of the headache however do sound migrainous in nature, but the patient has no pre-existing history of migraine.  The patient will be placed on Topamax working up to 50 mg at night.  She may take Tylenol if needed for the headache.  I will get a sleep evaluation.  She will follow-up in 3 months.  The examination today was done through the daughter who acted as an Astronomer.  Jill Alexanders MD 10/08/2018 8:46 AM  [facility] [address] [place], [place] [address]  Phone [phone] Fax [phone]

## 2018-10-17 ENCOUNTER — Ambulatory Visit (INDEPENDENT_AMBULATORY_CARE_PROVIDER_SITE_OTHER): Payer: Medicaid Other | Admitting: Neurology

## 2018-10-17 ENCOUNTER — Other Ambulatory Visit: Payer: Self-pay

## 2018-10-17 ENCOUNTER — Encounter: Payer: Self-pay | Admitting: Neurology

## 2018-10-17 DIAGNOSIS — R351 Nocturia: Secondary | ICD-10-CM | POA: Diagnosis not present

## 2018-10-17 DIAGNOSIS — Z6841 Body Mass Index (BMI) 40.0 and over, adult: Secondary | ICD-10-CM

## 2018-10-17 DIAGNOSIS — G4719 Other hypersomnia: Secondary | ICD-10-CM

## 2018-10-17 DIAGNOSIS — R0683 Snoring: Secondary | ICD-10-CM | POA: Diagnosis not present

## 2018-10-17 NOTE — Progress Notes (Signed)
Subjective:    Patient ID: Linda Acevedo is a 63 y.o. female.  HPI     Star Age, MD, PhD [facility] [address], [address] P.O. [place], [place] [address]  Dear Linda Acevedo,   I saw your patient, Linda Acevedo, upon your kind request in my sleep clinic today for initial consultation of her sleep disorder, in particular, concern for underlying obstructive sleep apnea.  The patient is accompanied by her daughter, [name], today, who helps with interpretation, as patient is Spanish speaking.  As you know, [name] is a 63 year old right-handed woman with an underlying medical history of depression, anxiety, arthritis, degenerative disc disease, hypertension, headaches and morbid obesity with a BMI of over 40, who reports snoring and excessive daytime  somnolence.  I reviewed your office note from 10/08/2018.  Her Epworth sleepiness score is 10 out of 24, fatigue severity score is 58 out of 63.  She snores and reports sleep disruption, she has had morning headaches and also nightmares and has yelled out in her sleep according to the daughter. Patient was recently started on Topamax for headache prevention and while it has significantly reduced her nightmares and headaches especially in the mornings, she has noticed some irritability and increase in her anxiety.  She just increased the dose to the next level last night.  Bedtime is around 8 and rise time around 6.  She has nocturia approximately 3 times out of the night on average.  She drinks caffeine in the form of coffee, 3 cups in the morning, rare alcohol, she is a non-smoker and does not work.  She lives with her daughter, she has 2 daughters.  There are 4 dogs in the household, none in her bedroom and she does not have a TV in her bedroom.  Her Past Medical History Is Significant For: Past Medical History:  Diagnosis Date  . Anxiety   . Arthritis   . Degenerative disc disease   . Depression    . Headache 10/08/2018  . Hypertension   . Morbid obesity ([place])     Her Past Surgical History Is Significant For: Past Surgical History:  Procedure Laterality Date  . ABLATION SAPHENOUS VEIN W/ RFA    . JOINT REPLACEMENT     knee    Her Family History Is Significant For: Family History  Problem Relation Age of Onset  . Diabetes Mother   . Cancer Father     Her Social History Is Significant For: Social History   Socioeconomic History  . Marital status: Legally Separated    Spouse name: Not on file  . Number of children: 2  . Years of education: Not on file  . Highest education level: Not on file  Occupational History  . Occupation: homemaker  Social Needs  . Financial resource strain: Not on file  . Food insecurity:    Worry: Not on file    Inability: Not on file  . Transportation needs:    Medical: Not on file    Non-medical: Not on file  Tobacco Use  . Smoking status: Never Smoker  . Smokeless tobacco: Never Used  Substance and Sexual Activity  . Alcohol use: Never    Frequency: Never  . Drug use: No  . Sexual activity: Never    Partners: Male  Lifestyle  . Physical activity:    Days per week: Not on file    Minutes per session: Not on file  . Stress: Not on  file  Relationships  . Social connections:    Talks on phone: Not on file    Gets together: Not on file    Attends religious service: Not on file    Active member of club or organization: Not on file    Attends meetings of clubs or organizations: Not on file    Relationship status: Not on file  Other Topics Concern  . Not on file  Social History Narrative   Lives with daughter Linda Acevedo   Right handed    2 cups of coffee daily     Her Allergies Are:  Allergies  Allergen Reactions  . Gabapentin Swelling    Face swelling  :   Her Current Medications Are:  Outpatient Encounter Medications as of 10/17/2018  Medication Sig  . DULoxetine (CYMBALTA) 30 MG capsule Take 60 mg by mouth daily.     . furosemide (LASIX) 20 MG tablet Take 20 mg by mouth daily.  . meloxicam (MOBIC) 15 MG tablet Take 15 mg by mouth daily.  Marland Kitchen OMEPRAZOLE PO Take 20 mg by mouth as needed.  Marland Kitchen oxyCODONE-acetaminophen (PERCOCET) 5-325 MG per tablet Take 1 tablet by mouth 2 (two) times daily as needed.    . pravastatin (PRAVACHOL) 20 MG tablet Take 20 mg by mouth daily.  Marland Kitchen topiramate (TOPAMAX) 25 MG tablet Take one tablet at night for one week, then take 2 tablets at night   No facility-administered encounter medications on file as of 10/17/2018.   :  Review of Systems:  Out of a complete 14 point review of systems, all are reviewed and negative with the exception of these symptoms as listed below:  Review of Systems  Neurological:       Pt presents today to discuss her sleep. Pt has never had a sleep study but does endorse snoring.  Epworth Sleepiness Scale 0= would never doze 1= slight chance of dozing 2= moderate chance of dozing 3= high chance of dozing  Sitting and reading: 0 Watching TV: 2 Sitting inactive in a public place (ex. Theater or meeting): 2 As a passenger in a car for an hour without a break: 1 Lying down to rest in the afternoon: 3 Sitting and talking to someone: 0 Sitting quietly after lunch (no alcohol): 1 In a car, while stopped in traffic: 1 Total: 10     Objective:  Neurological Exam  Physical Exam Physical Examination:   Vitals:   10/17/18 1319  BP: 135/89  Pulse: 73  Temp: (!) 96.6 F (35.9 C)    General Examination: The patient is a very pleasant 63 y.o. female in no acute distress. She appears well-developed and well-nourished and well groomed. She is mildly anxious appearing.  HEENT: Normocephalic, atraumatic, pupils are equal, round and reactive to light and accommodation. She wears corrective eyeglasses, extraocular tracking is well preserved in all directions.  Hearing is grossly intact.  Face is symmetric with normal facial animation and speech is clear  without hypophonia or voice tremor or dysarthria noted.  Neck shows full range of motion, she has no carotid bruits.  Airway examination reveals somewhat moderate airway crowding secondary to tonsils of 2-3+ and slightly elongated uvula, tongue protrudes centrally in palate elevates symmetrically, neck circumference is 16-1/4 inches, Mallampati is class III.  Chest: Clear to auscultation without wheezing, rhonchi or crackles noted.  Heart: S1+S2+0, regular and normal without murmurs, rubs or gallops noted.   Abdomen: Soft, non-tender and non-distended with normal bowel sounds appreciated on auscultation.  Extremities: There is no pitting edema in the distal lower extremities bilaterally. Nonpitting puffiness in both distal legs.  Skin: Warm and dry without trophic changes noted.  Musculoskeletal: exam reveals no obvious joint deformities, tenderness or joint swelling or erythema. Unremarkable scar from right total knee replacement surgery.  Neurologically:  Mental status: The patient is awake, alert and oriented in all 4 spheres. Her immediate and remote memory, attention, language skills and fund of knowledge are appropriate. There is no evidence of aphasia, agnosia, apraxia or anomia. Speech is clear with normal prosody and enunciation. Thought process is linear. Mood is normal and affect is normal.  Cranial nerves II - XII are as described above under HEENT exam. In addition: shoulder shrug is normal with equal shoulder height noted. Motor exam: Normal bulk, strength and tone is noted. There is no drift, tremor or rebound. Romberg is negative. Reflexes are 2+ in the UEs, Trace in the left leg, not elicited in the right knee, absent in both ankles, fine motor skills and coordination: intact with normal finger taps, normal hand movements, normal rapid alternating patting, normal foot taps and normal foot agility.  Cerebellar testing: No dysmetria or intention tremor, no truncal or gait ataxia.   Sensory exam: intact to light touch in the upper and lower extremities.  Gait, station and balance: She stands easily. No veering to one side is noted. No leaning to one side is noted. Posture is age-appropriate and stance is narrow based. Gait shows normal stride length and normal pace. No problems turning are noted.                Assessment and Plan:  In summary, Ceylin Dreibelbis is a very pleasant 63 y.o.-year old female with an underlying medical history of depression, anxiety, arthritis, degenerative disc disease, hypertension, headaches and morbid obesity with a BMI of over 40, whose history and physical exam are concerning for obstructive sleep apnea (OSA). I had a long chat with the patient and [name] about my findings and the diagnosis of OSA, its prognosis and treatment options. We talked about medical treatments, surgical interventions and non-pharmacological approaches. I explained in particular the risks and ramifications of untreated moderate to severe OSA, especially with respect to developing cardiovascular disease down the Road, including congestive heart failure, difficult to treat hypertension, cardiac arrhythmias, or stroke. Even type 2 diabetes has, in part, been linked to untreated OSA. Symptoms of untreated OSA include daytime sleepiness, memory problems, mood irritability and mood disorder such as depression and anxiety, lack of energy, as well as recurrent headaches, especially morning headaches. We talked about trying to maintain a healthy lifestyle in general, as well as the importance of weight control. I encouraged the patient to eat healthy, exercise daily and keep well hydrated, to keep a scheduled bedtime and wake time routine, to not skip any meals and eat healthy snacks in between meals. I advised the patient not to drive when feeling sleepy. I recommended the following at this time: sleep study. She has benefited as far as her headaches and nightmares are concerned  from taking Topamax but reports more anxiety and irritability.  She is encouraged to consider writing it out a little longer but if she continues to not feel good on this medication, she is encouraged to call our office and speak with your nurse.  I explained the sleep test procedure to the patient and also outlined possible surgical and non-surgical treatment options of OSA, including the use of a custom-made  dental device (which would require a referral to a specialist dentist or oral surgeon), upper airway surgical options, such as pillar implants, radiofrequency surgery, tongue base surgery, and UPPP (which would involve a referral to an ENT surgeon). Rarely, jaw surgery such as mandibular advancement may be considered.  I also explained the CPAP treatment option to the patient, who indicated that she would be willing to try CPAP if the need arises. I explained the importance of being compliant with PAP treatment, not only for insurance purposes but primarily to improve Her symptoms, and for the patient's long term health benefit, including to reduce Her cardiovascular risks. I answered all their questions today and the patient and her daughter were in agreement. I plan to see her back after the sleep study is completed and encouraged her to call with any interim questions, concerns, problems or updates.   Thank you very much for allowing me to participate in the care of this nice patient. If I can be of any further assistance to you please do not hesitate to talk to me.  Sincerely,   Star Age, MD, PhD

## 2018-10-17 NOTE — Patient Instructions (Signed)

## 2018-10-23 ENCOUNTER — Telehealth: Payer: Self-pay | Admitting: Neurology

## 2018-10-23 MED ORDER — DIVALPROEX SODIUM 250 MG PO DR TAB: DELAYED_RELEASE_TABLET | ORAL | 1 refills | Status: DC

## 2018-10-23 NOTE — Telephone Encounter (Signed)
Pt daughter states when pt was prescribed the topiramate (TOPAMAX) 25 MG tablet she was told to call if there were concerns with how the medication affects pt. Daughter states pt is angry about everything, she is very fatigued in the morning. Pt has more anxiety now.  Daughter has asked that Dr Jannifer Franklin be made aware.Please call

## 2018-10-23 NOTE — Addendum Note (Signed)
Addended by: Kathrynn Ducking on: 10/23/2018 05:36 PM   Modules accepted: Orders

## 2018-10-23 NOTE — Telephone Encounter (Signed)
I called the daughter.  The patient is not tolerating the Topamax, she is more irritable and possibly depressed.  She will stop the Topamax, I will start Depakote at the 250 mg dose, eventually going to 500 mg in the evening.

## 2018-11-20 ENCOUNTER — Ambulatory Visit (INDEPENDENT_AMBULATORY_CARE_PROVIDER_SITE_OTHER): Payer: Medicaid Other | Admitting: Neurology

## 2018-11-20 ENCOUNTER — Other Ambulatory Visit: Payer: Self-pay

## 2018-11-20 DIAGNOSIS — R0683 Snoring: Secondary | ICD-10-CM

## 2018-11-20 DIAGNOSIS — G4719 Other hypersomnia: Secondary | ICD-10-CM

## 2018-11-20 DIAGNOSIS — R351 Nocturia: Secondary | ICD-10-CM

## 2018-11-20 DIAGNOSIS — G4720 Circadian rhythm sleep disorder, unspecified type: Secondary | ICD-10-CM

## 2018-11-20 DIAGNOSIS — G4733 Obstructive sleep apnea (adult) (pediatric): Secondary | ICD-10-CM

## 2018-11-26 ENCOUNTER — Telehealth: Payer: Self-pay

## 2018-11-26 NOTE — Addendum Note (Signed)
Addended by: Star Age on: 11/26/2018 08:39 AM   Modules accepted: Orders

## 2018-11-26 NOTE — Telephone Encounter (Signed)
I called pt, spoke to pt's daughter [name], per DPR. I advised pt that Dr. Rexene Alberts reviewed their sleep study results and found that pt has moderate osa. Dr. Rexene Alberts recommends that pt start an auto pap at home. I reviewed PAP compliance expectations with the pt. Pt is agreeable to starting an auto-PAP. I advised pt that an order will be sent to a DME, [facility], and [facility] will call the pt within about one week after they file with the pt's insurance. [facility] will show the pt how to use the machine, fit for masks, and troubleshoot the auto-PAP if needed. A follow up appt was made for insurance purposes with Dr. Rexene Alberts on 02/13/19 at 8:30am. Pt verbalized understanding to arrive 15 minutes early and bring their auto-PAP. A letter with all of this information in it will be mailed to the pt as a reminder. I verified with the pt that the address we have on file is correct. Pt verbalized understanding of results. Pt had no questions at this time but was encouraged to call back if questions arise. I have sent the order to [facility] and have received confirmation that they have received the order.

## 2018-11-26 NOTE — Progress Notes (Signed)
Patient referred by Dr. Jannifer Franklin, seen by me on 10/17/18, PST on 11/20/18.   Patient is Spanish speaking, see if you can talk to [name], her daughter.  Please call and notify the patient that the recent home sleep test showed obstructive sleep apnea in the moderate range. While I recommend treatment for this in the form CPAP, we are not yet bringing patients in for in-lab testing for CPAP titration studies, due to the virus pandemic; therefore, I suggest we start her on autoPAP at home, which means, that we don't have to bring her in for another sleep study with CPAP, but will let her start using an autoPAP machine at home, through a DME company (of patient's choice, or as per insurance requirement, as per in [employer], if there are such restrictions, depending on insurance carrier). The DME representative will educate the patient on how to use the machine, how to put the mask on, etc. I have placed an order in the chart. Please send referral, talk to patient, send report to referring MD. We will need a FU in sleep clinic for 10 weeks post-PAP set up, please arrange that with me or one of our NPs.  Also, please remind patient about the importance of compliance with PAP usage; this is an Designer, industrial/product, but good compliance also helps [place] track improvements in patient's sleep related complaints and objective improvements, such as BP and weight for example or nocturia or headaches, etc. For concerns and questions about how to clean the PAP machine and the supplies and how frequently to change the hose, mask and filters, etc., patient can call the DME company, for more information, education and troubleshooting. Especially in the current situation, I recommend, patients be extra mindful about hand hygiene, handling the PAP equipment only with clean hands, wipe the mask daily, keep little one and four-legged companions (and any other pets for that matter) away from the machine and mask at all times.     Thanks,   Star Age, MD, PhD [facility])

## 2018-11-26 NOTE — Procedures (Signed)
PATIENT'S NAME:  Linda Acevedo, Linda Acevedo DOB:      March 29, 1956      MR#:    [id number]     DATE OF RECORDING: 11/20/2018 REFERRING M.D.:  Margette Fast, MD Study Performed:   Baseline Polysomnogram HISTORY: 63 year old woman with a history of depression, anxiety, arthritis, degenerative disc disease, hypertension, headaches and morbid obesity, who reports snoring and excessive daytime somnolence. The patient endorsed the Epworth Sleepiness Scale at 10 points. The patient's weight 280 pounds with a height of 67 (inches), resulting in a BMI of 43.9 kg/m2. The patient's neck circumference measured 16.2 inches.  CURRENT MEDICATIONS: Topamax, Cymbalta, Lasix, Mobic, Omeprazole, Percocet, Pravachol   PROCEDURE:  This is a multichannel digital polysomnogram utilizing the Somnostar 11.2 system.  Electrodes and sensors were applied and monitored per AASM Specifications.   EEG, EOG, Chin and Limb EMG, were sampled at 200 Hz.  ECG, Snore and Nasal Pressure, Thermal Airflow, Respiratory Effort, CPAP Flow and Pressure, Oximetry was sampled at 50 Hz. Digital video and audio were recorded.      BASELINE STUDY  Lights Out was at 20:38 and Lights On at 04:53.  Total recording time (TRT) was 496 minutes, with a total sleep time (TST) of 350 minutes.   The patient's sleep latency was 72.5 minutes, which is delayed. REM latency was 278.5 minutes, which is markedly delayed. The sleep efficiency was 70.6 %.     SLEEP ARCHITECTURE: WASO (Wake after sleep onset) was 99 minutes with mild to moderate sleep fragmentation noted. There were 32 minutes in Stage N1, 250.5 minutes Stage N2, 39.5 minutes Stage N3 and 28 minutes in Stage REM.  The percentage of Stage N1 was 9.1%, which is mildly increased, Stage N2 was 71.6%, which is increased, Stage N3 was 11.3% and Stage R (REM sleep) was 8.%, which is markedly reduced. The arousals were noted as: 89 were spontaneous, 0 were associated with PLMs, 63 were associated with respiratory  events.  RESPIRATORY ANALYSIS:  There were a total of 134 respiratory events:  16 obstructive apneas, 4 central apneas and 3 mixed apneas with a total of 23 apneas and an apnea index (AI) of 3.9 /hour. There were 111 hypopneas with a hypopnea index of 19. /hour. The patient also had 0 respiratory event related arousals (RERAs).      The total APNEA/HYPOPNEA INDEX (AHI) was 23./hour and the total RESPIRATORY DISTURBANCE INDEX was 23. /hour.  10 events occurred in REM sleep and 219 events in NREM. The REM AHI was 21.4 /hour, versus a non-REM AHI of 23.1. The patient spent 350 minutes of total sleep time in the supine position and 0 minutes in non-supine.. The supine AHI was 22.9 versus a non-supine AHI of 0.0.  OXYGEN SATURATION & C02:  The Wake baseline 02 saturation was 96%, with the lowest being 85%. Time spent below 89% saturation equaled 19 minutes.  PERIODIC LIMB MOVEMENTS: The patient had a total of 0 Periodic Limb Movements.  The Periodic Limb Movement (PLM) index was 0 and the PLM Arousal index was 0/hour.  Audio and video analysis did not show any abnormal or unusual movements, behaviors, phonations or vocalizations. The patient took 1 bathroom break. Mild to moderate snoring was noted. The EKG was in keeping with normal sinus rhythm (NSR). Post-study, the patient indicated that sleep was the same as usual.   IMPRESSION: 1. Obstructive Sleep Apnea(OSA) 2. Dysfunctions associated with sleep stages or arousal from sleep  RECOMMENDATIONS: 1. This sleep study demonstrates moderate obstructive sleep  apnea with a total AHI of 23/hour, REM AHI of 21.4/hour, supine AHI of 22.9/hour and O2 nadir of 85%. Treatment with positive airway pressure (PAP) - in the form of CPAP - is recommended. This will require, ideally, a full night CPAP titration study for proper treatment settings, O2 monitoring and mask fitting. Based on the severity of the sleep disordered breathing, an attended titration study is  indicated. However, under the current circumstances (i.e. the COVID-19 pandemic), in order to ensure continuity of care and for the safety of the patient and healthcare professionals, she will be advised to proceed with an autoPAP titration/trial at home. A proper overnight, lab-attended PAP titration study with CPAP may be helpful or needed down the road to optimize treatment, when considered safe. Please note, that untreated obstructive sleep apnea may carry additional perioperative morbidity. Patients with significant obstructive sleep apnea should receive perioperative PAP therapy and the surgeons and particularly the anesthesiologist should be informed of the diagnosis and the severity of the sleep disordered breathing. Patient will be reminded regarding compliance with the PAP machine and to be mindful of cleanliness with the equipment and timely with supply changes (i.e. changing filter, mask, hose, humidifier chamber on an ongoing basis, as recommended, and cleaning parts that touch the face and nose daily, etc).  2. This study shows sleep fragmentation and abnormal sleep stage percentages; these are nonspecific findings and per se do not signify an intrinsic sleep disorder or a cause for the patient's sleep-related symptoms. Causes include (but are not limited to) the first night effect of the sleep study, circadian rhythm disturbances, medication effect or an underlying mood disorder or medical problem.  3. The patient should be cautioned not to drive, work at heights, or operate dangerous or heavy equipment when tired or sleepy. Review and reiteration of good sleep hygiene measures should be pursued with any patient. 4. The patient will be seen in follow-up in the sleep clinic at [facility] for discussion of the test results, symptom and treatment compliance review, further management strategies, etc. The referring provider will be notified of the test results.  I certify that I have reviewed the entire raw  data recording prior to the issuance of this report in accordance with the Standards of Accreditation of the American Academy of Sleep Medicine (AASM)  Star Age, MD, PhD Diplomat, American Board of Neurology and Sleep Medicine (Neurology and Sleep Medicine)

## 2018-11-26 NOTE — Telephone Encounter (Signed)
-----   Message from Star Age, MD sent at 11/26/2018  8:39 AM EDT ----- Patient referred by Dr. Jannifer Franklin, seen by me on 10/17/18, PST on 11/20/18.   Patient is Spanish speaking, see if you can talk to [name], her daughter.  Please call and notify the patient that the recent home sleep test showed obstructive sleep apnea in the moderate range. While I recommend treatment for this in the form CPAP, we are not yet bringing patients in for in-lab testing for CPAP titration studies, due to the virus pandemic; therefore, I suggest we start her on autoPAP at home, which means, that we don't have to bring her in for another sleep study with CPAP, but will let her start using an autoPAP machine at home, through a DME company (of patient's choice, or as per insurance requirement, as per in [employer], if there are such restrictions, depending on insurance carrier). The DME representative will educate the patient on how to use the machine, how to put the mask on, etc. I have placed an order in the chart. Please send referral, talk to patient, send report to referring MD. We will need a FU in sleep clinic for 10 weeks post-PAP set up, please arrange that with me or one of our NPs.  Also, please remind patient about the importance of compliance with PAP usage; this is an Designer, industrial/product, but good compliance also helps [place] track improvements in patient's sleep related complaints and objective improvements, such as BP and weight for example or nocturia or headaches, etc. For concerns and questions about how to clean the PAP machine and the supplies and how frequently to change the hose, mask and filters, etc., patient can call the DME company, for more information, education and troubleshooting. Especially in the current situation, I recommend, patients be extra mindful about hand hygiene, handling the PAP equipment only with clean hands, wipe the mask daily, keep little one and four-legged companions (and any other pets  for that matter) away from the machine and mask at all times.     Thanks,   Star Age, MD, PhD [facility])

## 2018-12-27 ENCOUNTER — Other Ambulatory Visit: Payer: Self-pay | Admitting: Neurology

## 2019-01-15 ENCOUNTER — Other Ambulatory Visit: Payer: Self-pay

## 2019-01-15 ENCOUNTER — Encounter: Payer: Self-pay | Admitting: Neurology

## 2019-01-15 ENCOUNTER — Ambulatory Visit: Payer: Medicaid Other | Admitting: Neurology

## 2019-01-15 VITALS — BP 110/78 | HR 68 | Temp 97.8°F | Ht 67.0 in | Wt 281.5 lb

## 2019-01-15 DIAGNOSIS — G4489 Other headache syndrome: Secondary | ICD-10-CM | POA: Diagnosis not present

## 2019-01-15 MED ORDER — DIVALPROEX SODIUM 500 MG PO DR TAB: 500.0000 mg | DELAYED_RELEASE_TABLET | Freq: Two times a day (BID) | ORAL | 4 refills | Status: DC

## 2019-01-15 NOTE — Patient Instructions (Signed)
Depakote (valproic acid) is a seizure medication that also has an FDA approval for migraine headache. The most common potential side effects of this medication include weight gain, tremor, or possible stomach upset. This medication can potentially cause liver problems. If confusion is noted on this medication, contact our office immediately.   We will go up on the Depakote to 500 mg twice a day.

## 2019-01-15 NOTE — Progress Notes (Signed)
Reason for visit: Headache, anxiety  Linda Acevedo is an 63 y.o. female  History of present illness:  Linda Acevedo is a 63 year old right-handed Hispanic female with a history of anxiety and depression in the past.  The patient had begun to have headaches in the early morning hours, waking up from sleep associated with nightmares.  The patient was sent for sleep evaluation was found to have a moderate level of obstructive sleep apnea, she is now on CPAP.  She could not tolerate Topamax as it made her anxious and nervous, she was switched to Depakote currently taking 500 mg in the evening.  This has had some benefit with the headaches, she only has about 1 headache a week and the headaches are less severe.  She still has occasional nightmares.  She is still having issues with anxiety and depression.  She returns this office for an evaluation.  She comes in with her daughter who is acting as an Astronomer.  Past Medical History:  Diagnosis Date  . Anxiety   . Arthritis   . Degenerative disc disease   . Depression   . Headache 10/08/2018  . Hypertension   . Morbid obesity ([place])     Past Surgical History:  Procedure Laterality Date  . ABLATION SAPHENOUS VEIN W/ RFA    . JOINT REPLACEMENT     knee    Family History  Problem Relation Age of Onset  . Diabetes Mother   . Cancer Father     Social history:  reports that she has never smoked. She has never used smokeless tobacco. She reports that she does not drink alcohol or use drugs.    Allergies  Allergen Reactions  . Gabapentin Swelling    Face swelling    Medications:  Prior to Admission medications   Medication Sig Start Date End Date Taking? Authorizing Provider  divalproex (DEPAKOTE) 250 MG DR tablet Take 2 tablets at bedtime 12/30/18  Yes Kathrynn Ducking, MD  DULoxetine (CYMBALTA) 30 MG capsule Take 60 mg by mouth daily.     Yes [provider]  furosemide (LASIX) 20 MG tablet Take 20 mg by mouth  daily.   Yes [provider]  meloxicam (MOBIC) 15 MG tablet Take 15 mg by mouth daily.   Yes [provider]  OMEPRAZOLE PO Take 20 mg by mouth as needed.   Yes [provider]  oxyCODONE-acetaminophen (PERCOCET) 5-325 MG per tablet Take 1 tablet by mouth 2 (two) times daily as needed.     Yes [provider]  pravastatin (PRAVACHOL) 20 MG tablet Take 20 mg by mouth daily.   Yes [provider]    ROS:  Out of a complete 14 system review of symptoms, the patient complains only of the following symptoms, and all other reviewed systems are negative.  Anxiety, depression Headache  Blood pressure 110/78, pulse 68, temperature 97.8 F (36.6 C), height 5\' 7"  (1.702 m), weight 281 lb 8 oz (127.7 kg), SpO2 96 %.  Physical Exam  General: The patient is alert and cooperative at the time of the examination.  The patient is moderately to markedly obese.  Skin: No significant peripheral edema is noted.   Neurologic Exam  Mental status: The patient is alert and oriented x 3 at the time of the examination. The patient has apparent normal recent and remote memory, with an apparently normal attention span and concentration ability.   Cranial nerves: Facial symmetry is present. Speech is normal,  no aphasia or dysarthria is noted. Extraocular movements are full. Visual fields are full.  Motor: The patient has good strength in all 4 extremities.  Sensory examination: Soft touch sensation is symmetric on the face, arms, and legs.  Coordination: The patient has good finger-nose-finger and heel-to-shin bilaterally.  Gait and station: The patient has a normal gait. Tandem gait is slightly unsteady. Romberg is negative. No drift is seen.  Reflexes: Deep tendon reflexes are symmetric.   Assessment/Plan:  1.  History of headache  2.  Anxiety and depression  3.  Sleep apnea on CPAP  The patient will be increased on the Depakote taking 500 mg twice  daily.  If the issues with headache and anxiety continue, they are to contact my office in several weeks, we may consider a low-dose of clonazepam in the evening for anxiety and to suppress nightmares.  She will otherwise follow-up in 6 months.  Jill Alexanders MD 01/15/2019 9:50 AM  [facility] [address] [place], [place] [address]  Phone [phone] Fax [phone]

## 2019-01-17 ENCOUNTER — Encounter: Payer: Self-pay | Admitting: Gastroenterology

## 2019-01-23 ENCOUNTER — Encounter: Payer: Self-pay | Admitting: Gastroenterology

## 2019-01-29 ENCOUNTER — Other Ambulatory Visit: Payer: Self-pay | Admitting: Thoracic Surgery (Cardiothoracic Vascular Surgery)

## 2019-01-29 DIAGNOSIS — I712 Thoracic aortic aneurysm, without rupture, unspecified: Secondary | ICD-10-CM

## 2019-01-29 NOTE — Progress Notes (Unsigned)
ct 

## 2019-02-10 ENCOUNTER — Encounter: Payer: Self-pay | Admitting: Neurology

## 2019-02-13 ENCOUNTER — Other Ambulatory Visit: Payer: Self-pay

## 2019-02-13 ENCOUNTER — Encounter: Payer: Self-pay | Admitting: Neurology

## 2019-02-13 ENCOUNTER — Ambulatory Visit (AMBULATORY_SURGERY_CENTER): Payer: Self-pay

## 2019-02-13 ENCOUNTER — Ambulatory Visit: Payer: Medicaid Other | Admitting: Neurology

## 2019-02-13 VITALS — Temp 97.1°F | Ht 67.0 in | Wt 280.0 lb

## 2019-02-13 VITALS — BP 156/90 | HR 68 | Ht 67.0 in | Wt 280.0 lb

## 2019-02-13 DIAGNOSIS — G4733 Obstructive sleep apnea (adult) (pediatric): Secondary | ICD-10-CM | POA: Diagnosis not present

## 2019-02-13 DIAGNOSIS — Z9989 Dependence on other enabling machines and devices: Secondary | ICD-10-CM | POA: Diagnosis not present

## 2019-02-13 DIAGNOSIS — Z86010 Personal history of colonic polyps: Secondary | ICD-10-CM

## 2019-02-13 MED ORDER — SUPREP BOWEL PREP KIT 17.5-3.13-1.6 GM/177ML PO SOLN: 1.0000 | Freq: Once | ORAL | 0 refills | Status: AC

## 2019-02-13 NOTE — Progress Notes (Signed)
Subjective:    Patient ID: Linda Acevedo is a 63 y.o. female.  HPI     Interim history:  Linda Acevedo is a 63 year old right-handed woman with an underlying medical history of depression, anxiety, arthritis, degenerative disc disease, hypertension, headaches and morbid obesity with a BMI of over 35, who presents for follow-up consultation of her obstructive sleep apnea after recent sleep study testing and starting AutoPap therapy.  The patient is accompanied by her daughter again today.  I first met her on 10/17/2018 at the request of Dr. Jannifer Franklin, at which time she reported snoring and daytime somnolence.  She was having recurrent headaches.  She was advised to proceed with a sleep study.  She had a baseline sleep study on 11/20/2018.  I went over her test results with her.  Sleep efficiency was reduced at 70.6%, sleep latency delayed at 72.5 minutes, REM latency markedly delayed at 278.5 minutes.  She had an increased percentage of stage I and stage II sleep.  She had a significantly decreased percentage of REM sleep at 8%.  Total AHI was in the moderate range at 23/h, average oxygen saturation was 96%, nadir was 85%.  She had no significant PLM's.  Given the COVID-19 pandemic and the fact that we had not started doing laboratory attended titration studies at the time, she was advised to proceed with AutoPap therapy at home.  Today, 02/13/2019: I reviewed her AutoPap compliance data from 01/12/2019 through 02/10/2019 which is a total of 30 days, during which time she used her AutoPap machine every night with percent use days greater than 4 hours at 87%, indicating very good compliance with an average usage of 5 hours and 15 minutes, residual AHI at goal at 0.8/h, average pressure for the 95th percentile at 10.1 cm with a range of 6 cm to 12 cm with EPR, leak on the high side with a 95th percentile at 27.5 L/min.  Set up date was 12/10/2018.  She reports feeling better.  Her headaches are better.   She does feel better rested.  She is motivated to continue with treatment.  She does not always put it back on after going to the bathroom.  She has nocturia about once or twice per average night, feels like it is the same as before but headaches are definitely better.  She sleeps typically in a semi-prone position which is difficult and often dislodges the mask and her daughter has even heard the leak from her bedroom.  She uses a hybrid full facemask, likely [place] or DreamWear, from the description.  The patient's allergies, current medications, family history, past medical history, past social history, past surgical history and problem list were reviewed and updated as appropriate.   Previously:  10/17/2018: (She) reports snoring and excessive daytime  somnolence.  I reviewed your office note from 10/08/2018.  Her Epworth sleepiness score is 10 out of 24, fatigue severity score is 58 out of 63.  She snores and reports sleep disruption, she has had morning headaches and also nightmares and has yelled out in her sleep according to the daughter. Patient was recently started on Topamax for headache prevention and while it has significantly reduced her nightmares and headaches especially in the mornings, she has noticed some irritability and increase in her anxiety.  She just increased the dose to the next level last night.  Bedtime is around 8 and rise time around 6.  She has nocturia approximately 3 times out of the night on  average.  She drinks caffeine in the form of coffee, 3 cups in the morning, rare alcohol, she is a non-smoker and does not work.  She lives with her daughter, she has 2 daughters.  There are 4 dogs in the household, none in her bedroom and she does not have a TV in her bedroom.  Her Past Medical History Is Significant For: Past Medical History:  Diagnosis Date  . Anxiety   . Arthritis   . Degenerative disc disease   . Depression   . Headache 10/08/2018  . Hypertension   .  Morbid obesity ([place])     Her Past Surgical History Is Significant For: Past Surgical History:  Procedure Laterality Date  . ABLATION SAPHENOUS VEIN W/ RFA    . JOINT REPLACEMENT     knee    Her Family History Is Significant For: Family History  Problem Relation Age of Onset  . Diabetes Mother   . Cancer Father     Her Social History Is Significant For: Social History   Socioeconomic History  . Marital status: Legally Separated    Spouse name: Not on file  . Number of children: 2  . Years of education: Not on file  . Highest education level: Not on file  Occupational History  . Occupation: homemaker  Social Needs  . Financial resource strain: Not on file  . Food insecurity    Worry: Not on file    Inability: Not on file  . Transportation needs    Medical: Not on file    Non-medical: Not on file  Tobacco Use  . Smoking status: Never Smoker  . Smokeless tobacco: Never Used  Substance and Sexual Activity  . Alcohol use: Never    Frequency: Never  . Drug use: No  . Sexual activity: Never    Partners: Male  Lifestyle  . Physical activity    Days per week: Not on file    Minutes per session: Not on file  . Stress: Not on file  Relationships  . Social Herbalist on phone: Not on file    Gets together: Not on file    Attends religious service: Not on file    Active member of club or organization: Not on file    Attends meetings of clubs or organizations: Not on file    Relationship status: Not on file  Other Topics Concern  . Not on file  Social History Narrative   Lives with daughter Linda Acevedo   Right handed    2 cups of coffee daily     Her Allergies Are:  Allergies  Allergen Reactions  . Gabapentin Swelling    Face swelling  :   Her Current Medications Are:  Outpatient Encounter Medications as of 02/13/2019  Medication Sig  . divalproex (DEPAKOTE) 500 MG DR tablet Take 1 tablet (500 mg total) by mouth 2 (two) times daily.  . DULoxetine  (CYMBALTA) 30 MG capsule Take 60 mg by mouth daily.    . furosemide (LASIX) 20 MG tablet Take 20 mg by mouth daily.  . meloxicam (MOBIC) 15 MG tablet Take 15 mg by mouth daily.  Marland Kitchen OMEPRAZOLE PO Take 20 mg by mouth as needed.  Marland Kitchen oxyCODONE-acetaminophen (PERCOCET) 5-325 MG per tablet Take 1 tablet by mouth 2 (two) times daily as needed.    . pravastatin (PRAVACHOL) 20 MG tablet Take 20 mg by mouth daily.   No facility-administered encounter medications on file as of 02/13/2019.   :  Review of Systems:  Out of a complete 14 point review of systems, all are reviewed and negative with the exception of these symptoms as listed below: Review of Systems  Neurological:       Pt presents today to discuss her auto pap. Pt reports that it is going well.    Objective:  Neurological Exam  Physical Exam Physical Examination:   Vitals:   02/13/19 0829  BP: (!) 156/90  Pulse: 68    General Examination: The patient is a very pleasant 63 y.o. female in no acute distress. She appears well-developed and well-nourished and well groomed.   HEENT: Normocephalic, atraumatic, pupils are equal, round and reactive to light and accommodation. She wears corrective eyeglasses, extraocular tracking is well preserved in all directions.  Hearing is grossly intact.  Face is symmetric with normal facial animation and speech is clear without hypophonia or voice tremor or dysarthria noted.  Neck shows FROM, no carotid bruits.  Airway examination reveals somewhat moderate airway crowding secondary to tonsils of 2-3+ and slightly elongated uvula, tongue protrudes centrally in palate elevates symmetrically, Mild mouth dryness noted. Puffy tissue anterior neck, feels like fatty tissue, no lumps or bumps, does not feel like a goiter.  Chest: Clear to auscultation without wheezing, rhonchi or crackles noted.  Heart: S1+S2+0, regular and normal without murmurs, rubs or gallops noted.   Abdomen: Soft, non-tender and  non-distended with normal bowel sounds appreciated on auscultation.  Extremities: There is no pitting edema in the distal lower extremities bilaterally. Nonpitting puffiness in both distal legs.  Skin: Warm and dry without trophic changes noted.  Musculoskeletal: exam reveals no obvious joint deformities, tenderness or joint swelling or erythema. s/p R TKA.   Neurologically:  Mental status: The patient is awake, alert and oriented in all 4 spheres. Her immediate and remote memory, attention, language skills and fund of knowledge are appropriate. There is no evidence of aphasia, agnosia, apraxia or anomia. Speech is clear with normal prosody and enunciation. Thought process is linear. Mood is normal and affect is normal.  Cranial nerves II - XII are as described above under HEENT exam.  Motor exam: Normal bulk, strength and tone is noted. There is no tremor, fine motor skills and coordination: grossly intact.  Cerebellar testing: No dysmetria or intention tremor, no truncal or gait ataxia.  Sensory exam: intact to light touch in the upper and lower extremities.  Gait, station and balance: She stands easily. No veering to one side is noted. No leaning to one side is noted. Posture is age-appropriate and stance is narrow based. Gait shows normal stride length and normal pace. No problems turning are noted.                Assessment and Plan:  In summary, Linda Acevedo is a very pleasant 63 year old female with an underlying medical history of depression, anxiety, arthritis, degenerative disc disease, hypertension, headaches and morbid obesity with a BMI of over 68, who Presents for follow-up consultation of her obstructive sleep apnea after a baseline sleep study on 11/20/2018 showed moderate obstructive sleep apnea.  She has established treatment since 12/10/2018 on AutoPap at home.  She is compliant with treatment and has benefited from it.  In particular, her headaches have improved.  She  is motivated to continue with treatment.  She is advised that sleeping in the prone position will continue to be difficult given the fact that she is using a fullface mask and generally speaking even with  a small interface such as nasal prongs of nasal cushion, semi-prone sleeping is difficult to get a good seal.  Nevertheless, she is encouraged to continue with treatment and encouraged to put the mask back on after she goes to the bathroom at night.  She is advised to routinely follow-up with 1 of our nurse practitioners in 6 months, and hopefully from the sleep apnea standpoint she can be seen yearly thereafter. I reiterated the importance of treating moderate obstructive sleep apnea, not just for symptom control but also to prevent cardiovascular complications long-term.  I answered all her questions today and the patient and her daughter were in agreement

## 2019-02-13 NOTE — Patient Instructions (Addendum)
I am glad to hear you are feeling better especially with your headaches. Please continue using your autoPAP regularly. While your insurance requires that you use PAP at least 4 hours each night on 70% of the nights, I recommend, that you not skip any nights and use it throughout the night if you can. Getting used to PAP and staying with the treatment long term does take time and patience and discipline. Untreated obstructive sleep apnea when it is moderate to severe can have an adverse impact on cardiovascular health and raise her risk for heart disease, arrhythmias, hypertension, congestive heart failure, stroke and diabetes. Untreated obstructive sleep apnea causes sleep disruption, nonrestorative sleep, and sleep deprivation. This can have an impact on your day to day functioning and cause daytime sleepiness and impairment of cognitive function, memory loss, mood disturbance, and problems focussing. Using PAP regularly can improve these symptoms.  Please try to keep using your AutoPap for 7 to 8 hours per average night, try to put it back on once you come back from your bathroom break at night.   Keep up the good work! Please follow up, with Butler Denmark, Dr. Jannifer Franklin' NP in 6 months and hopefully from the sleep apnea standpoint you can be seen on a yearly basis after that.

## 2019-02-13 NOTE — Progress Notes (Signed)
[name] (daughter)  was the interpretor with the pt during her PV today. The daughter helped answer medical questions and helped with prep instructions. Per interpretor, no allergies to soy or egg products. Pt not taking any weight loss meds or using  O2 at home.  Per interpretor, pt denies sedation problems. Refused emmi video.

## 2019-02-20 ENCOUNTER — Telehealth: Payer: Self-pay

## 2019-02-20 NOTE — Telephone Encounter (Signed)
Pt returned call and answered “No” to all questions.  °  °Pt made aware of that care partner may come to the lobby during the procedure but will need to provide their own mask. ° ° °

## 2019-02-20 NOTE — Telephone Encounter (Signed)
Covid-19 screening questions   Do you now or have you had a fever in the last 14 days?  Do you have any respiratory symptoms of shortness of breath or cough now or in the last 14 days?  Do you have any family members or close contacts with diagnosed or suspected Covid-19 in the past 14 days?  Have you been tested for Covid-19 and found to be positive?       

## 2019-02-21 ENCOUNTER — Other Ambulatory Visit: Payer: Self-pay

## 2019-02-21 ENCOUNTER — Ambulatory Visit (AMBULATORY_SURGERY_CENTER): Payer: Medicaid Other | Admitting: Gastroenterology

## 2019-02-21 VITALS — BP 126/70 | HR 68 | Temp 99.2°F | Resp 20 | Ht 67.0 in | Wt 280.0 lb

## 2019-02-21 DIAGNOSIS — D123 Benign neoplasm of transverse colon: Secondary | ICD-10-CM | POA: Diagnosis not present

## 2019-02-21 DIAGNOSIS — Z86010 Personal history of colonic polyps: Secondary | ICD-10-CM | POA: Diagnosis not present

## 2019-02-21 DIAGNOSIS — D125 Benign neoplasm of sigmoid colon: Secondary | ICD-10-CM

## 2019-02-21 DIAGNOSIS — K635 Polyp of colon: Secondary | ICD-10-CM | POA: Diagnosis not present

## 2019-02-21 DIAGNOSIS — D122 Benign neoplasm of ascending colon: Secondary | ICD-10-CM

## 2019-02-21 MED ORDER — SODIUM CHLORIDE 0.9 % IV SOLN: 500.0000 mL | Freq: Once | INTRAVENOUS | Status: DC

## 2019-02-21 NOTE — Progress Notes (Signed)
Pt's states no medical or surgical changes since previsit or office visit.Pt's states no medical or surgical changes since previsit or office visit.Pt's states no medical or surgical changes since previsit or office visit.Pt's states no medical or surgical changes since previsit or office visit.  Carmin Muskrat iterpreter  [facility] [place] [facility] KA

## 2019-02-21 NOTE — Op Note (Signed)
[facility] Patient Name: Linda Acevedo Procedure Date: 02/21/2019 10:39 AM MRN: [id number] Endoscopist: Jackquline Denmark , MD Age: 63 Referring MD:  Date of Birth: Sep 20, 1955 Gender: Female Account #: [id number] Procedure:                Colonoscopy Indications:              High risk colon cancer surveillance: Personal                            history of colonic polyps Medicines:                Monitored Anesthesia Care Procedure:                Pre-Anesthesia Assessment:                           - Prior to the procedure, a History and Physical                            was performed, and patient medications and                            allergies were reviewed. The patient's tolerance of                            previous anesthesia was also reviewed. The risks                            and benefits of the procedure and the sedation                            options and risks were discussed with the patient.                            All questions were answered, and informed consent                            was obtained. Prior Anticoagulants: The patient has                            taken no previous anticoagulant or antiplatelet                            agents. ASA Grade Assessment: II - A patient with                            mild systemic disease. After reviewing the risks                            and benefits, the patient was deemed in                            satisfactory condition to undergo the procedure.  After obtaining informed consent, the colonoscope                            was passed under direct vision. Throughout the                            procedure, the patient's blood pressure, pulse, and                            oxygen saturations were monitored continuously. The                            Colonoscope was introduced through the anus and                            advanced to the the cecum,  identified by                            appendiceal orifice and ileocecal valve. The                            colonoscopy was performed without difficulty. The                            patient tolerated the procedure well. The quality                            of the bowel preparation was adequate to identify                            polyps. Some retained solid vegetable material                            throughout the colon specially the right side of                            the colon and transverse colon. Aggressive                            suctioning and aspiration was performed. Overall,                            90 to 95% the colonic process visualized                            satisfactorily. The ileocecal valve, appendiceal                            orifice, and rectum were photographed. Scope In: 10:52:32 AM Scope Out: 11:16:43 AM Scope Withdrawal Time: 0 hours 15 minutes 52 seconds  Total Procedure Duration: 0 hours 24 minutes 11 seconds  Findings:                 Two sessile polyps were found in the proximal  transverse colon and proximal ascending colon. The                            polyps were 3 to 4 mm in size. These polyps were                            removed with a cold biopsy forceps. Resection and                            retrieval were complete.                           Two sessile polyps were found in the distal sigmoid                            colon. The polyps were 6 mm in size. These polyps                            were removed with a cold biopsy forceps. Resection                            and retrieval were complete.                           A few small-mouthed diverticula were found in the                            sigmoid colon.                           Non-bleeding internal hemorrhoids were found during                            retroflexion. The hemorrhoids were small.                           The  entire examined colon appeared normal on direct                            and retroflexion views. Complications:            No immediate complications. Estimated Blood Loss:     Estimated blood loss: none. Impression:               -Small colonic polyps S/P polypectomy.                           -Mild sigmoid diverticulosis.                           -Non-bleeding internal hemorrhoids.                           -Otherwise grossly normal colonoscopy. Recommendation:           - Patient has a contact number available for  emergencies. The signs and symptoms of potential                            delayed complications were discussed with the                            patient. Return to normal activities tomorrow.                            Written discharge instructions were provided to the                            patient.                           - Resume previous diet.                           - Continue present medications.                           - Await pathology results.                           - Repeat colonoscopy for surveillance based on                            pathology results.                           - Return to GI clinic PRN. Jackquline Denmark, MD 02/21/2019 11:22:52 AM This report has been signed electronically.

## 2019-02-21 NOTE — Progress Notes (Signed)
Called to room to assist during endoscopic procedure.  Patient ID and intended procedure confirmed with present staff. Received instructions for my participation in the procedure from the performing physician.  

## 2019-02-21 NOTE — Progress Notes (Signed)
To PACU, VSS. Report to [email or web address] 

## 2019-02-21 NOTE — Patient Instructions (Signed)
Impression/Recommendations:  Polyp handout given to patient. Diverticulosis handout given to patient. Hemorrhoid handout given to patient.   Resume previous diet. Continue present medications. Await pathology results.  Repeat colonoscopy for surveillance.  Date to be determined after pathology results reviewed.  Return to GI clinic as needed.  YOU HAD AN ENDOSCOPIC PROCEDURE TODAY AT [facility]:   Refer to the procedure report that was given to you for any specific questions about what was found during the examination.  If the procedure report does not answer your questions, please call your gastroenterologist to clarify.  If you requested that your care partner not be given the details of your procedure findings, then the procedure report has been included in a sealed envelope for you to review at your convenience later.  YOU SHOULD EXPECT: Some feelings of bloating in the abdomen. Passage of more gas than usual.  Walking can help get rid of the air that was put into your GI tract during the procedure and reduce the bloating. If you had a lower endoscopy (such as a colonoscopy or flexible sigmoidoscopy) you may notice spotting of blood in your stool or on the toilet paper. If you underwent a bowel prep for your procedure, you may not have a normal bowel movement for a few days.  Please Note:  You might notice some irritation and congestion in your nose or some drainage.  This is from the oxygen used during your procedure.  There is no need for concern and it should clear up in a day or so.  SYMPTOMS TO REPORT IMMEDIATELY:   Following lower endoscopy (colonoscopy or flexible sigmoidoscopy):  Excessive amounts of blood in the stool  Significant tenderness or worsening of abdominal pains  Swelling of the abdomen that is new, acute  Fever of 100F or higher For urgent or emergent issues, a gastroenterologist can be reached at any hour by calling [phone].   DIET:   We do recommend a small meal at first, but then you may proceed to your regular diet.  Drink plenty of fluids but you should avoid alcoholic beverages for 24 hours.  ACTIVITY:  You should plan to take it easy for the rest of today and you should NOT DRIVE or use heavy machinery until tomorrow (because of the sedation medicines used during the test).    FOLLOW UP: Our staff will call the number listed on your records 48-72 hours following your procedure to check on you and address any questions or concerns that you may have regarding the information given to you following your procedure. If we do not reach you, we will leave a message.  We will attempt to reach you two times.  During this call, we will ask if you have developed any symptoms of COVID 19. If you develop any symptoms (ie: fever, flu-like symptoms, shortness of breath, cough etc.) before then, please call [phone].  If you test positive for Covid 19 in the 2 weeks post procedure, please call and report this information to [place].    If any biopsies were taken you will be contacted by phone or by letter within the next 1-3 weeks.  Please call us at [phone] if you have not heard about the biopsies in 3 weeks.    SIGNATURES/CONFIDENTIALITY: You and/or your care partner have signed paperwork which will be entered into your electronic medical record.  These signatures attest to the fact that that the information above on your After Visit Summary has  been reviewed and is understood.  Full responsibility of the confidentiality of this discharge information lies with you and/or your care-partner.

## 2019-02-25 ENCOUNTER — Telehealth: Payer: Self-pay

## 2019-02-25 NOTE — Telephone Encounter (Signed)
  Follow up Call-  Call back number 02/21/2019  Post procedure Call Back phone  # [phone] Ernst Breach daughter call her  Permission to leave phone message Yes  Some recent data might be hidden     Patient questions:  Do you have a fever, pain , or abdominal swelling? No. Pain Score  0 *  Have you tolerated food without any problems? Yes.    Have you been able to return to your normal activities? Yes.    Do you have any questions about your discharge instructions: Diet   No. Medications  No. Follow up visit  No.  Do you have questions or concerns about your Care? No.  Actions: * If pain score is 4 or above: No action needed, pain <4.  1. Have you developed a fever since your procedure? no  2.   Have you had an respiratory symptoms (SOB or cough) since your procedure? no  3.   Have you tested positive for COVID 19 since your procedure no  4.   Have you had any family members/close contacts diagnosed with the COVID 19 since your procedure?  no   If yes to any of these questions please route to Joylene John, RN and Alphonsa Gin, Therapist, sports.

## 2019-02-25 NOTE — Telephone Encounter (Signed)
Follow up call attempted with daughter Ernst Breach as requested.  NALM

## 2019-03-04 ENCOUNTER — Encounter: Payer: Self-pay | Admitting: Gastroenterology

## 2019-03-04 ENCOUNTER — Ambulatory Visit: Payer: Medicaid Other | Admitting: Thoracic Surgery (Cardiothoracic Vascular Surgery)

## 2019-03-06 ENCOUNTER — Other Ambulatory Visit: Payer: Medicaid Other

## 2019-03-11 ENCOUNTER — Ambulatory Visit: Payer: Medicaid Other | Admitting: Thoracic Surgery (Cardiothoracic Vascular Surgery)

## 2019-03-13 ENCOUNTER — Ambulatory Visit
Admission: RE | Admit: 2019-03-13 | Discharge: 2019-03-13 | Disposition: A | Discharge status: Home / Self Care | Payer: Medicaid Other | Source: Ambulatory Visit | Attending: Thoracic Surgery (Cardiothoracic Vascular Surgery) | Admitting: Thoracic Surgery (Cardiothoracic Vascular Surgery)

## 2019-03-13 DIAGNOSIS — I712 Thoracic aortic aneurysm, without rupture, unspecified: Secondary | ICD-10-CM

## 2019-03-18 ENCOUNTER — Encounter: Payer: Self-pay | Admitting: Thoracic Surgery (Cardiothoracic Vascular Surgery)

## 2019-03-18 ENCOUNTER — Other Ambulatory Visit: Payer: Self-pay

## 2019-03-18 ENCOUNTER — Ambulatory Visit: Payer: Medicaid Other | Admitting: Thoracic Surgery (Cardiothoracic Vascular Surgery)

## 2019-03-18 VITALS — BP 129/85 | HR 76 | Temp 97.7°F | Resp 20 | Ht 67.0 in | Wt 280.0 lb

## 2019-03-18 DIAGNOSIS — I712 Thoracic aortic aneurysm, without rupture, unspecified: Secondary | ICD-10-CM

## 2019-03-18 NOTE — Progress Notes (Signed)
[address]       [place] [address]             [phone]    HPI: [name] returns for a scheduled follow-up visit  Linda Acevedo is a 63 year old woman with a past medical history significant for hypertension, obesity, degenerative joint disease, pulmonary embolism following knee replacement, and a 4.1 cm ascending aortic aneurysm.  Mrs. Cordero underwent a knee replacement in June 2018.  She developed chest pain and shortness of breath a couple of months later.  The CT showed pulmonary emboli.  She was also noted to have a 4 cm ascending aortic aneurysm.  I have been following her since that time.  I last saw her in October 2019.  The aneurysm was stable.  In the interim since her last visit she has been feeling well.  She denies chest pain, pressure, or tightness.  She does get short of breath with activity.  Her daughter is with her and translating.  She says that she feels fatigued more easily than she used to.  Past Medical History:  Diagnosis Date  . Anxiety   . Arthritis   . Degenerative disc disease   . Depression   . GERD (gastroesophageal reflux disease)   . Headache 10/08/2018  . Hypertension   . Morbid obesity ([place])   . Sleep apnea    uses a c-pap    Current Outpatient Medications  Medication Sig Dispense Refill  . DULoxetine (CYMBALTA) 30 MG capsule Take 60 mg by mouth daily.      . furosemide (LASIX) 20 MG tablet Take 20 mg by mouth daily.    . meloxicam (MOBIC) 15 MG tablet Take 15 mg by mouth daily.    Marland Kitchen OMEPRAZOLE PO Take 20 mg by mouth as needed.    Marland Kitchen oxyCODONE-acetaminophen (PERCOCET) 5-325 MG per tablet Take 1 tablet by mouth 2 (two) times daily as needed.      . pravastatin (PRAVACHOL) 20 MG tablet Take 20 mg by mouth daily.     Current Facility-Administered Medications  Medication Dose Route Frequency Provider Last Rate Last Dose  . 0.9 %  sodium chloride infusion  500 mL Intravenous Once Jackquline Denmark, MD         Physical Exam BP 129/85   Pulse 76   Temp 97.7 F (36.5 C) (Skin)   Resp 20   Ht 5\' 7"  (1.702 m)   Wt 280 lb (127 kg)   SpO2 96% Comment: RA  BMI 43.35 kg/m  63 year old woman in no acute distress Obese Alert and oriented x3 with no focal motor deficit No carotid bruits Cardiac regular rate and rhythm, normal S1 and S2 Lungs clear No peripheral edema  Diagnostic Tests: CT CHEST WITHOUT CONTRAST  TECHNIQUE: Multidetector CT imaging of the chest was performed following the standard protocol without IV contrast.  COMPARISON:  03/19/2018  FINDINGS: Cardiovascular: 4.1 cm ascending thoracic aortic aneurysm is stable since previous study. No evidence of mediastinal hematoma or pericardial effusion.  Mediastinum/Nodes: No masses or pathologically enlarged lymph nodes identified on this unenhanced exam.  Lungs/Pleura: No pulmonary infiltrate or mass identified. No effusion present.  Upper Abdomen:  Unremarkable.  Musculoskeletal:  No suspicious bone lesions.  IMPRESSION: Stable 4.1 cm ascending thoracic aortic aneurysm. Recommend annual imaging followup by CTA or MRA. This recommendation follows 2010 ACCF/AHA/AATS/ACR/ASA/SCA/SCAI/SIR/STS/SVM Guidelines for the Diagnosis and Management of Patients with Thoracic Aortic Disease. Circulation. 2010; [id number]. Aortic aneurysm NOS (ICD10-I71.9)  Electronically Signed   By: Marlaine Hind M.D.   On: 03/13/2019 15:24 I personally reviewed the CT images and concur with the findings noted above  Impression: Linda Acevedo is a 63 year old woman with a history of hypertension, obesity, pulmonary embolus, ascending aortic aneurysm, degenerative joint disease, anxiety, and depression.  Ascending aortic aneurysm-noted in 2018 when she presented with chest pain and shortness of breath and was found to have a pulmonary embolus.  It has remained stable.  Currently measures 4.1 cm.  Needs continued annual  follow-up.  Importance of blood pressure control was emphasized.  Hypertension-blood pressure well controlled on diuretic  I emphasized the importance of aerobic exercise and weight loss for her overall wellbeing.  Plan: Return in 1 year with CT angiogram chest  Melrose Nakayama, MD [facility] [phone]

## 2019-05-20 DIAGNOSIS — M25552 Pain in left hip: Secondary | ICD-10-CM | POA: Diagnosis not present

## 2019-05-20 DIAGNOSIS — R1032 Left lower quadrant pain: Secondary | ICD-10-CM | POA: Diagnosis not present

## 2019-07-01 ENCOUNTER — Other Ambulatory Visit: Payer: Self-pay | Admitting: Physician Assistant

## 2019-07-01 DIAGNOSIS — M47816 Spondylosis without myelopathy or radiculopathy, lumbar region: Secondary | ICD-10-CM | POA: Diagnosis not present

## 2019-07-01 DIAGNOSIS — M5137 Other intervertebral disc degeneration, lumbosacral region: Secondary | ICD-10-CM | POA: Diagnosis not present

## 2019-07-01 DIAGNOSIS — M25559 Pain in unspecified hip: Secondary | ICD-10-CM

## 2019-07-01 DIAGNOSIS — M5442 Lumbago with sciatica, left side: Secondary | ICD-10-CM | POA: Diagnosis not present

## 2019-07-01 MED ORDER — OXYCODONE-ACETAMINOPHEN 7.5-325 MG PO TABS: 1.0000 | ORAL_TABLET | Freq: Two times a day (BID) | ORAL | 0 refills | Status: DC

## 2019-07-07 DIAGNOSIS — M5416 Radiculopathy, lumbar region: Secondary | ICD-10-CM | POA: Diagnosis not present

## 2019-07-14 ENCOUNTER — Other Ambulatory Visit: Payer: Self-pay

## 2019-07-14 ENCOUNTER — Ambulatory Visit (INDEPENDENT_AMBULATORY_CARE_PROVIDER_SITE_OTHER): Payer: Medicaid Other | Admitting: Physician Assistant

## 2019-07-14 ENCOUNTER — Encounter: Payer: Self-pay | Admitting: Physician Assistant

## 2019-07-14 VITALS — BP 118/72 | HR 87 | Temp 98.0°F | Resp 16 | Ht 67.0 in | Wt 278.0 lb

## 2019-07-14 DIAGNOSIS — R600 Localized edema: Secondary | ICD-10-CM | POA: Diagnosis not present

## 2019-07-14 DIAGNOSIS — E559 Vitamin D deficiency, unspecified: Secondary | ICD-10-CM

## 2019-07-14 DIAGNOSIS — Z6841 Body Mass Index (BMI) 40.0 and over, adult: Secondary | ICD-10-CM

## 2019-07-14 DIAGNOSIS — E782 Mixed hyperlipidemia: Secondary | ICD-10-CM | POA: Diagnosis not present

## 2019-07-14 DIAGNOSIS — F419 Anxiety disorder, unspecified: Secondary | ICD-10-CM

## 2019-07-14 NOTE — Progress Notes (Signed)
Established Patient Office Visit  Subjective:  Patient ID: Linda Acevedo, female    DOB: [birth year]  Age: 64 y.o. MRN: [id number]  CC:  Chief Complaint  Patient presents with  . Follow-up  . Hyperlipidemia    HPI Linda Acevedo presents for follow up hyperlipidemia and chronic back pain  Pt currently on pravachol 20mg  qd for hyperlipidemia - is tolerating well and voices no problems or concerns  She is currently following with [facility] and will be having MRI on lower spine next week - she is currently taking percocet, flexeril and nabumetone She states she has pain in her lower back with occasional pain that radiates down both legs  Pt with history of vit D def - is taking weekly supplement - due for labwork  Pt with history of anxiety and depression -is currently on cymbalta 60mg  qd Tolerating medication well  Past Medical History:  Diagnosis Date  . Anxiety   . Arthritis   . Degenerative disc disease   . Depression   . GERD (gastroesophageal reflux disease)   . Headache 10/08/2018  . Hypertension   . Morbid obesity ([place])   . Sleep apnea    uses a c-pap    Past Surgical History:  Procedure Laterality Date  . ABLATION SAPHENOUS VEIN W/ RFA     Bil  . COLONOSCOPY    . JOINT REPLACEMENT     knee/right    Family History  Problem Relation Age of Onset  . Diabetes Mother   . Cancer Father     Social History   Socioeconomic History  . Marital status: Legally Separated    Spouse name: Not on file  . Number of children: 2  . Years of education: Not on file  . Highest education level: Not on file  Occupational History  . Occupation: homemaker  Tobacco Use  . Smoking status: Never Smoker  . Smokeless tobacco: Never Used  Substance and Sexual Activity  . Alcohol use: Never  . Drug use: No  . Sexual activity: Never    Partners: Male  Other Topics Concern  . Not on file  Social History Narrative   Lives with daughter [name]    Right handed    2 cups of coffee daily    Social Determinants of Health   Financial Resource Strain:   . Difficulty of Paying Living Expenses: Not on file  Food Insecurity:   . Worried About Charity fundraiser in the Last Year: Not on file  . Ran Out of Food in the Last Year: Not on file  Transportation Needs:   . Lack of Transportation (Medical): Not on file  . Lack of Transportation (Non-Medical): Not on file  Physical Activity:   . Days of Exercise per Week: Not on file  . Minutes of Exercise per Session: Not on file  Stress:   . Feeling of Stress : Not on file  Social Connections:   . Frequency of Communication with Friends and Family: Not on file  . Frequency of Social Gatherings with Friends and Family: Not on file  . Attends Religious Services: Not on file  . Active Member of Clubs or Organizations: Not on file  . Attends Archivist Meetings: Not on file  . Marital Status: Not on file  Intimate Partner Violence:   . Fear of Current or Ex-Partner: Not on file  . Emotionally Abused: Not on file  . Physically Abused: Not on file  .  Sexually Abused: Not on file     Current Outpatient Medications:  .  cyclobenzaprine (FLEXERIL) 10 MG tablet, Take by mouth., Disp: , Rfl:  .  DULoxetine (CYMBALTA) 60 MG capsule, TAKE 1 CAPSULE BY MOUTH EVERY DAY, Disp: , Rfl:  .  ergocalciferol (VITAMIN D2) 1.25 MG (50000 UT) capsule, Take 50,000 Units by mouth once a week., Disp: , Rfl:  .  fluticasone (FLONASE) 50 MCG/ACT nasal spray, Place into both nostrils daily., Disp: , Rfl:  .  nabumetone (RELAFEN) 500 MG tablet, TAKE 1 TABLET BY MOUTH TWICE A DAY, Disp: , Rfl:  .  furosemide (LASIX) 20 MG tablet, Take 20 mg by mouth daily., Disp: , Rfl:  .  omeprazole (PRILOSEC) 20 MG capsule, Take by mouth., Disp: , Rfl:  .  oxyCODONE-acetaminophen (PERCOCET) 7.5-325 MG tablet, Take 1 tablet by mouth 2 (two) times daily., Disp: 60 tablet, Rfl: 0 .  pravastatin (PRAVACHOL) 20 MG tablet,  Take 20 mg by mouth daily., Disp: , Rfl:   Current Facility-Administered Medications:  .  0.9 %  sodium chloride infusion, 500 mL, Intravenous, Once, Jackquline Denmark, MD   Allergies  Allergen Reactions  . Gabapentin Swelling and Anaphylaxis    Face swelling    ROS CONSTITUTIONAL: Negative for chills, fatigue, fever, unintentional weight gain and unintentional weight loss.  E/N/T: Negative for ear pain, nasal congestion and sore throat.  CARDIOVASCULAR: Negative for chest pain, dizziness, palpitations and pedal edema.  RESPIRATORY: Negative for recent cough and dyspnea.  GASTROINTESTINAL: Negative for abdominal pain, acid reflux symptoms, constipation, diarrhea, nausea and vomiting.  MSK: see HPI.  INTEGUMENTARY: Negative for rash.  NEUROLOGICAL: Negative for dizziness and headaches.  PSYCHIATRIC: Negative for sleep disturbance and to question depression screen.  Negative for depression, negative for anhedonia.        Objective:    PHYSICAL EXAM:   VS: BP 118/72   Pulse 87   Temp 98 F (36.7 C)   Resp 16   Ht 5\' 7"  (1.702 m)   Wt 278 lb (126.1 kg)   SpO2 97%   BMI 43.54 kg/m   GEN: Well nourished, well developed, in no acute distress    Cardiac: RRR; no murmurs, rubs, or gallops,no edema - no significant varicosities Respiratory:  normal respiratory rate and pattern with no distress - normal breath sounds with no rales, rhonchi, wheezes or rubs GI: normal bowel sounds, no masses or tenderness MS: no deformity or atrophy  Skin: warm and dry, no rash  Neuro:  Alert and Oriented x 3, Strength and sensation are intact - CN II-Xii grossly intact Psych: euthymic mood, appropriate affect and demeanor  BP 118/72   Pulse 87   Temp 98 F (36.7 C)   Resp 16   Ht 5\' 7"  (1.702 m)   Wt 278 lb (126.1 kg)   SpO2 97%   BMI 43.54 kg/m  Wt Readings from Last 3 Encounters:  07/14/19 278 lb (126.1 kg)  03/18/19 280 lb (127 kg)  02/21/19 280 lb (127 kg)     Health  Maintenance Due  Topic Date Due  . Hepatitis C Screening  03/08/1956  . HIV Screening  05/27/1970  . TETANUS/TDAP  05/27/1974  . PAP SMEAR-Modifier  05/27/1976  . MAMMOGRAM  05/27/2005    There are no preventive care reminders to display for this patient.  No results found for: TSH Lab Results  Component Value Date   WBC 6.8 11/23/2008   HGB 12.9 11/23/2008   HCT 38.0  11/23/2008   MCV 90.9 11/23/2008   PLT 175 11/23/2008   Lab Results  Component Value Date   NA 141 11/23/2008   K 3.9 11/23/2008   GLUCOSE 107 (H) 11/23/2008   BUN 15 11/23/2008   CREATININE 0.8 11/23/2008   No results found for: CHOL No results found for: HDL No results found for: LDLCALC No results found for: TRIG No results found for: CHOLHDL No results found for: HGBA1C    Assessment & Plan:   Problem List Items Addressed This Visit      Other   Edema   Relevant Orders   CBC with Differential/Platelet   Comprehensive metabolic panel   TSH    Other Visit Diagnoses    Mixed hyperlipidemia    -  Primary   Relevant Orders   Lipid panel   Vitamin D deficiency       Relevant Orders   VITAMIN D 25 Hydroxy (Vit-D Deficiency, Fractures)   Morbid obesity with BMI of 40.0-44.9, adult ([place])   (Chronic)        No orders of the defined types were placed in this encounter.   Follow-up: Return in about 6 months (around 01/11/2020).    SARA R Mariellen Blaney, PA-C

## 2019-07-14 NOTE — Assessment & Plan Note (Signed)
Continue cymbalta 60mg  qd

## 2019-07-14 NOTE — Assessment & Plan Note (Signed)
Low chol diet labwork pending Continue pravachol 20mg  qd

## 2019-07-14 NOTE — Assessment & Plan Note (Signed)
Continue VIT d weekly - labwork pending

## 2019-07-14 NOTE — Assessment & Plan Note (Signed)
Efforts at weight loss Healthy diet

## 2019-07-14 NOTE — Assessment & Plan Note (Signed)
Continue lasix as directed labwork pending

## 2019-07-15 LAB — CBC WITH DIFFERENTIAL/PLATELET
Basophils Absolute: 0 10*3/uL (ref 0.0–0.2)
Basos: 0 %
EOS (ABSOLUTE): 0.1 10*3/uL (ref 0.0–0.4)
Eos: 1 %
Hematocrit: 45.8 % (ref 34.0–46.6)
Hemoglobin: 15.1 g/dL (ref 11.1–15.9)
Immature Grans (Abs): 0 10*3/uL (ref 0.0–0.1)
Immature Granulocytes: 0 %
Lymphocytes Absolute: 3.5 10*3/uL — ABNORMAL HIGH (ref 0.7–3.1)
Lymphs: 45 %
MCH: 30.7 pg (ref 26.6–33.0)
MCHC: 33 g/dL (ref 31.5–35.7)
MCV: 93 fL (ref 79–97)
Monocytes Absolute: 0.4 10*3/uL (ref 0.1–0.9)
Monocytes: 6 %
Neutrophils Absolute: 3.6 10*3/uL (ref 1.4–7.0)
Neutrophils: 48 %
Platelets: 310 10*3/uL (ref 150–450)
RBC: 4.92 x10E6/uL (ref 3.77–5.28)
RDW: 13.4 % (ref 11.7–15.4)
WBC: 7.6 10*3/uL (ref 3.4–10.8)

## 2019-07-15 LAB — COMPREHENSIVE METABOLIC PANEL
ALT: 14 IU/L (ref 0–32)
AST: 13 IU/L (ref 0–40)
Albumin/Globulin Ratio: 1.3 (ref 1.2–2.2)
Albumin: 3.9 g/dL (ref 3.8–4.8)
Alkaline Phosphatase: 117 IU/L (ref 39–117)
BUN/Creatinine Ratio: 16 (ref 12–28)
BUN: 14 mg/dL (ref 8–27)
Bilirubin Total: 0.3 mg/dL (ref 0.0–1.2)
CO2: 25 mmol/L (ref 20–29)
Calcium: 9.3 mg/dL (ref 8.7–10.3)
Chloride: 104 mmol/L (ref 96–106)
Creatinine, Ser: 0.87 mg/dL (ref 0.57–1.00)
GFR calc Af Amer: 81 mL/min/{1.73_m2} (ref >59)
GFR calc non Af Amer: 71 mL/min/{1.73_m2} (ref >59)
Globulin, Total: 3 g/dL (ref 1.5–4.5)
Glucose: 83 mg/dL (ref 65–99)
Potassium: 4.7 mmol/L (ref 3.5–5.2)
Sodium: 144 mmol/L (ref 134–144)
Total Protein: 6.9 g/dL (ref 6.0–8.5)

## 2019-07-15 LAB — LIPID PANEL
Chol/HDL Ratio: 2.8 ratio (ref 0.0–4.4)
Cholesterol, Total: 214 mg/dL — ABNORMAL HIGH (ref 100–199)
HDL: 77 mg/dL (ref >39)
LDL Chol Calc (NIH): 124 mg/dL — ABNORMAL HIGH (ref 0–99)
Triglycerides: 71 mg/dL (ref 0–149)
VLDL Cholesterol Cal: 13 mg/dL (ref 5–40)

## 2019-07-15 LAB — CARDIOVASCULAR RISK ASSESSMENT

## 2019-07-15 LAB — VITAMIN D 25 HYDROXY (VIT D DEFICIENCY, FRACTURES): Vit D, 25-Hydroxy: 30.2 ng/mL (ref 30.0–100.0)

## 2019-07-15 LAB — TSH: TSH: 4.01 u[IU]/mL (ref 0.450–4.500)

## 2019-07-21 ENCOUNTER — Ambulatory Visit: Payer: Medicaid Other | Admitting: Neurology

## 2019-08-07 NOTE — Progress Notes (Signed)
PATIENT: Linda Acevedo DOB: 1955/11/30  REASON FOR VISIT: follow up HISTORY FROM: patient  HISTORY OF PRESENT ILLNESS: Today 08/11/19  [name] is a 64 year old Hispanic female with history of anxiety, depression, and headaches. She is on CPAP AutoPap for moderate sleep apnea.  She no longer reports headaches.  She has discontinued Depakote for at least 4 months.  Her CPAP download indicates usage days 28/30 (93%), greater than 4 hours usage, 15 days (50%), average usage 3 hours 44 minutes.  Minimum pressure 6, max pressure 12.  AHI 0.4, leak 95th percentile 33.1.  Recently, she has not been using the CPAP greater than 4 hours due to back pain, having to adjust her position for comfort that is not conducive for wearing CPAP mask.  Fortunately, she is set to receive an epidural steroid injection for back pain this week.  She is hopeful this will allow her to be more compliant with CPAP.  She is Spanish-speaking, presents today for follow-up accompanied by her daughter.  ESS was 7.    HISTORY 01/15/2019 Dr. Jannifer Franklin: [name] is a 64 year old right-handed Hispanic female with a history of anxiety and depression in the past.  The patient had begun to have headaches in the early morning hours, waking up from sleep associated with nightmares.  The patient was sent for sleep evaluation was found to have a moderate level of obstructive sleep apnea, she is now on CPAP.  She could not tolerate Topamax as it made her anxious and nervous, she was switched to Depakote currently taking 500 mg in the evening.  This has had some benefit with the headaches, she only has about 1 headache a week and the headaches are less severe.  She still has occasional nightmares.  She is still having issues with anxiety and depression.  She returns this office for an evaluation.  She comes in with her daughter who is acting as an Astronomer.  REVIEW OF SYSTEMS: Out of a complete 14 system review of symptoms, the  patient complains only of the following symptoms, and all other reviewed systems are negative.  Sleep apnea  ALLERGIES: Allergies  Allergen Reactions  . Gabapentin Swelling and Anaphylaxis    Face swelling    HOME MEDICATIONS: Outpatient Medications Prior to Visit  Medication Sig Dispense Refill  . cyclobenzaprine (FLEXERIL) 10 MG tablet Take by mouth.    . DULoxetine (CYMBALTA) 60 MG capsule TAKE 1 CAPSULE BY MOUTH EVERY DAY    . ergocalciferol (VITAMIN D2) 1.25 MG (50000 UT) capsule Take 50,000 Units by mouth once a week.    . fluticasone (FLONASE) 50 MCG/ACT nasal spray Place into both nostrils daily.    . furosemide (LASIX) 20 MG tablet Take 20 mg by mouth daily.    . nabumetone (RELAFEN) 500 MG tablet TAKE 1 TABLET BY MOUTH TWICE A DAY    . omeprazole (PRILOSEC) 20 MG capsule Take by mouth.    . oxyCODONE-acetaminophen (PERCOCET) 7.5-325 MG tablet Take 1 tablet by mouth 2 (two) times daily. 60 tablet 0  . pravastatin (PRAVACHOL) 20 MG tablet Take 20 mg by mouth daily.     No facility-administered medications prior to visit.    PAST MEDICAL HISTORY: Past Medical History:  Diagnosis Date  . Anxiety   . Arthritis   . Degenerative disc disease   . Depression   . GERD (gastroesophageal reflux disease)   . Headache 10/08/2018  . Hypertension   . Morbid obesity ([place])   . Sleep  apnea    uses a c-pap    PAST SURGICAL HISTORY: Past Surgical History:  Procedure Laterality Date  . ABLATION SAPHENOUS VEIN W/ RFA     Bil  . COLONOSCOPY    . JOINT REPLACEMENT     knee/right    FAMILY HISTORY: Family History  Problem Relation Age of Onset  . Diabetes Mother   . Cancer Father     SOCIAL HISTORY: Social History   Socioeconomic History  . Marital status: Legally Separated    Spouse name: Not on file  . Number of children: 2  . Years of education: Not on file  . Highest education level: Not on file  Occupational History  . Occupation: homemaker  Tobacco Use  .  Smoking status: Never Smoker  . Smokeless tobacco: Never Used  Substance and Sexual Activity  . Alcohol use: Never  . Drug use: No  . Sexual activity: Never    Partners: Male  Other Topics Concern  . Not on file  Social History Narrative   Lives with daughter Linda Acevedo   Right handed    2 cups of coffee daily    Social Determinants of Health   Financial Resource Strain:   . Difficulty of Paying Living Expenses:   Food Insecurity:   . Worried About Charity fundraiser in the Last Year:   . Arboriculturist in the Last Year:   Transportation Needs:   . Film/video editor (Medical):   Marland Kitchen Lack of Transportation (Non-Medical):   Physical Activity:   . Days of Exercise per Week:   . Minutes of Exercise per Session:   Stress:   . Feeling of Stress :   Social Connections:   . Frequency of Communication with Friends and Family:   . Frequency of Social Gatherings with Friends and Family:   . Attends Religious Services:   . Active Member of Clubs or Organizations:   . Attends Archivist Meetings:   Marland Kitchen Marital Status:   Intimate Partner Violence:   . Fear of Current or Ex-Partner:   . Emotionally Abused:   Marland Kitchen Physically Abused:   . Sexually Abused:       PHYSICAL EXAM  Vitals:   08/11/19 0733  BP: 129/86  Pulse: 84  Temp: 97.7 F (36.5 C)  Weight: 281 lb 9.6 oz (127.7 kg)  Height: 5\' 7"  (1.702 m)   Body mass index is 44.1 kg/m.  Generalized: Well developed, in no acute distress   Neurological examination  Mentation: Alert oriented to time, place, is Spanish-speaking, history is provided by her daughter. Follows all commands. Cranial nerve II-XII: Pupils were equal round reactive to light. Extraocular movements were full, visual field were full on confrontational test. Facial sensation and strength were normal. Head turning and shoulder shrug were normal and symmetric. Motor: Good strength of all extremities Sensory: Sensory testing is intact to soft touch  on all 4 extremities. No evidence of extinction is noted.  Coordination: Cerebellar testing reveals good finger-nose-finger and heel-to-shin bilaterally.  Gait and station: Slow to rise from seated position with pushoff, gait is antalgic on the left Reflexes: Deep tendon reflexes are symmetric   DIAGNOSTIC DATA (LABS, IMAGING, TESTING) - I reviewed patient records, labs, notes, testing and imaging myself where available.  Lab Results  Component Value Date   WBC 7.6 07/14/2019   HGB 15.1 07/14/2019   HCT 45.8 07/14/2019   MCV 93 07/14/2019   PLT 310 07/14/2019  Component Value Date/Time   NA 144 07/14/2019 0924   K 4.7 07/14/2019 0924   CL 104 07/14/2019 0924   CO2 25 07/14/2019 0924   GLUCOSE 83 07/14/2019 0924   GLUCOSE 107 (H) 11/23/2008 0445   BUN 14 07/14/2019 0924   CREATININE 0.87 07/14/2019 0924   CALCIUM 9.3 07/14/2019 0924   PROT 6.9 07/14/2019 0924   ALBUMIN 3.9 07/14/2019 0924   AST 13 07/14/2019 0924   ALT 14 07/14/2019 0924   ALKPHOS 117 07/14/2019 0924   BILITOT 0.3 07/14/2019 0924   GFRNONAA 71 07/14/2019 0924   GFRAA 81 07/14/2019 0924   Lab Results  Component Value Date   CHOL 214 (H) 07/14/2019   HDL 77 07/14/2019   LDLCALC 124 (H) 07/14/2019   TRIG 71 07/14/2019   CHOLHDL 2.8 07/14/2019   No results found for: HGBA1C No results found for: VITAMINB12 Lab Results  Component Value Date   TSH 4.010 07/14/2019      ASSESSMENT AND PLAN 64 y.o. year old female  has a past medical history of Anxiety, Arthritis, Degenerative disc disease, Depression, GERD (gastroesophageal reflux disease), Headache (10/08/2018), Hypertension, Morbid obesity ([place]), and Sleep apnea. here with:  1.  History of headache, seen by Dr. Jannifer Franklin previously, no longer on Depakote 2.  Sleep apnea on CPAP, managed by Dr. Rexene Alberts  She no longer complains of headache as result of CPAP usage.  Her CPAP compliance is suboptimal of recent due to low back pain, scheduled to receive  an epidural injection this week.  She is hopeful this will allow her to be more compliant, we reviewed need to use CPAP for greater than 4 hours every night.  Recent download indicates leak in the 95th percentile 33.1, I will order mask refit.  She will contact our office in 6 weeks, we can review a download to ensure improvement.  She will otherwise follow-up in 1 year or sooner if needed.  She is no longer taking Depakote for headaches.    I spent 30 minutes of face-to-face and non-face-to-face time with patient. This included previsit chart review, lab review, study review, order entry, electronic health record documentation, patient education.   Butler Denmark, AGNP-C, DNP 08/11/2019, 7:41 AM [facility] [address] [place], [place] [address] [phone]

## 2019-08-11 ENCOUNTER — Ambulatory Visit: Payer: Medicaid Other | Admitting: Neurology

## 2019-08-11 ENCOUNTER — Other Ambulatory Visit: Payer: Self-pay

## 2019-08-11 ENCOUNTER — Encounter: Payer: Self-pay | Admitting: Neurology

## 2019-08-11 VITALS — BP 129/86 | HR 84 | Temp 97.7°F | Ht 67.0 in | Wt 281.6 lb

## 2019-08-11 DIAGNOSIS — G4733 Obstructive sleep apnea (adult) (pediatric): Secondary | ICD-10-CM | POA: Diagnosis not present

## 2019-08-11 DIAGNOSIS — R519 Headache, unspecified: Secondary | ICD-10-CM | POA: Diagnosis not present

## 2019-08-11 DIAGNOSIS — Z9989 Dependence on other enabling machines and devices: Secondary | ICD-10-CM | POA: Diagnosis not present

## 2019-08-11 NOTE — Progress Notes (Signed)
I have read the note, and I agree with the clinical assessment and plan.  Chayanne Filippi K Morganna Styles   

## 2019-08-11 NOTE — Progress Notes (Signed)
Order for mask refit sent to [employer] via community message. Confirmation received that the order transmitted was successful.  

## 2019-08-11 NOTE — Patient Instructions (Addendum)
Stay off the Depakote  Try to use CPAP > 4 hours every night I will order mask refit through your DME company Call if 6 weeks, we can pull another download F/u in 1 year

## 2019-08-14 DIAGNOSIS — M5136 Other intervertebral disc degeneration, lumbar region: Secondary | ICD-10-CM | POA: Insufficient documentation

## 2019-08-14 DIAGNOSIS — M47816 Spondylosis without myelopathy or radiculopathy, lumbar region: Secondary | ICD-10-CM | POA: Insufficient documentation

## 2019-09-24 ENCOUNTER — Other Ambulatory Visit: Payer: Self-pay

## 2019-09-24 DIAGNOSIS — M25559 Pain in unspecified hip: Secondary | ICD-10-CM

## 2019-09-24 MED ORDER — MELOXICAM 15 MG PO TABS: 15.0000 mg | ORAL_TABLET | Freq: Every day | ORAL | 1 refills | Status: DC

## 2019-09-24 MED ORDER — OXYCODONE-ACETAMINOPHEN 7.5-325 MG PO TABS: 1.0000 | ORAL_TABLET | Freq: Two times a day (BID) | ORAL | 0 refills | Status: DC

## 2019-10-23 DIAGNOSIS — M461 Sacroiliitis, not elsewhere classified: Secondary | ICD-10-CM | POA: Insufficient documentation

## 2019-10-23 DIAGNOSIS — M1612 Unilateral primary osteoarthritis, left hip: Secondary | ICD-10-CM | POA: Insufficient documentation

## 2019-11-20 ENCOUNTER — Other Ambulatory Visit: Payer: Self-pay | Admitting: Family Medicine

## 2019-12-10 ENCOUNTER — Other Ambulatory Visit: Payer: Self-pay

## 2019-12-10 ENCOUNTER — Ambulatory Visit (INDEPENDENT_AMBULATORY_CARE_PROVIDER_SITE_OTHER): Payer: Medicaid Other | Admitting: Physician Assistant

## 2019-12-10 ENCOUNTER — Encounter: Payer: Self-pay | Admitting: Physician Assistant

## 2019-12-10 VITALS — BP 110/78 | HR 86 | Temp 97.8°F | Ht 67.0 in | Wt 290.0 lb

## 2019-12-10 DIAGNOSIS — M79605 Pain in left leg: Secondary | ICD-10-CM | POA: Insufficient documentation

## 2019-12-10 DIAGNOSIS — G8929 Other chronic pain: Secondary | ICD-10-CM | POA: Insufficient documentation

## 2019-12-10 DIAGNOSIS — Z86718 Personal history of other venous thrombosis and embolism: Secondary | ICD-10-CM

## 2019-12-10 NOTE — Assessment & Plan Note (Signed)
Venous doppler ordered

## 2019-12-10 NOTE — Progress Notes (Signed)
Acute Office Visit  Subjective:    Patient ID: Linda Acevedo, female    DOB: [birth year], 64 y.o.   MRN: [id number]  Chief Complaint  Patient presents with  . Knee Pain    Behind left knee    HPI Patient is in today for left leg pain Pt has stated in the past week she has had pain behind her left knee associated with mild edema and warmth - she denies history of injury or trauma Pt denies chest pain or shortness of breath  Past Medical History:  Diagnosis Date  . Anxiety   . Arthritis   . Degenerative disc disease   . Depression   . GERD (gastroesophageal reflux disease)   . Headache 10/08/2018  . Hypertension   . Morbid obesity ([place])   . Sleep apnea    uses a c-pap    Past Surgical History:  Procedure Laterality Date  . ABLATION SAPHENOUS VEIN W/ RFA     Bil  . COLONOSCOPY    . JOINT REPLACEMENT     knee/right    Family History  Problem Relation Age of Onset  . Diabetes Mother   . Cancer Father     Social History   Socioeconomic History  . Marital status: Legally Separated    Spouse name: Not on file  . Number of children: 2  . Years of education: Not on file  . Highest education level: Not on file  Occupational History  . Occupation: homemaker  Tobacco Use  . Smoking status: Never Smoker  . Smokeless tobacco: Never Used  Vaping Use  . Vaping Use: Never used  Substance and Sexual Activity  . Alcohol use: Never  . Drug use: No  . Sexual activity: Never    Partners: Male  Other Topics Concern  . Not on file  Social History Narrative   Lives with daughter [name]   Right handed    2 cups of coffee daily    Social Determinants of Health   Financial Resource Strain:   . Difficulty of Paying Living Expenses:   Food Insecurity:   . Worried About Charity fundraiser in the Last Year:   . Arboriculturist in the Last Year:   Transportation Needs:   . Film/video editor (Medical):   Marland Kitchen Lack of Transportation (Non-Medical):   Physical  Activity:   . Days of Exercise per Week:   . Minutes of Exercise per Session:   Stress:   . Feeling of Stress :   Social Connections:   . Frequency of Communication with Friends and Family:   . Frequency of Social Gatherings with Friends and Family:   . Attends Religious Services:   . Active Member of Clubs or Organizations:   . Attends Archivist Meetings:   Marland Kitchen Marital Status:   Intimate Partner Violence:   . Fear of Current or Ex-Partner:   . Emotionally Abused:   Marland Kitchen Physically Abused:   . Sexually Abused:      Current Outpatient Medications:  .  cyclobenzaprine (FLEXERIL) 10 MG tablet, Take by mouth., Disp: , Rfl:  .  DULoxetine (CYMBALTA) 60 MG capsule, TAKE 1 CAPSULE BY MOUTH EVERY DAY, Disp: 30 capsule, Rfl: 5 .  ergocalciferol (VITAMIN D2) 1.25 MG (50000 UT) capsule, Take 50,000 Units by mouth once a week., Disp: , Rfl:  .  fluticasone (FLONASE) 50 MCG/ACT nasal spray, Place into both nostrils daily., Disp: , Rfl:  .  furosemide (LASIX)  20 MG tablet, Take 20 mg by mouth daily., Disp: , Rfl:  .  lidocaine (LIDODERM) 5 %, Apply patch to painful area. Patch may remain in place for up to 12 hours in a 24 hour period., Disp: , Rfl:  .  meloxicam (MOBIC) 15 MG tablet, Take 1 tablet (15 mg total) by mouth daily. One po qhs, Disp: 90 tablet, Rfl: 1 .  omeprazole (PRILOSEC) 20 MG capsule, Take by mouth., Disp: , Rfl:  .  oxyCODONE-acetaminophen (PERCOCET) 7.5-325 MG tablet, Take by mouth., Disp: , Rfl:  .  pravastatin (PRAVACHOL) 20 MG tablet, Take 20 mg by mouth daily., Disp: , Rfl:    Allergies  Allergen Reactions  . Gabapentin Swelling and Anaphylaxis    Face swelling    CONSTITUTIONAL: Negative for chills, fatigue, fever, unintentional weight gain and unintentional weight loss.  CARDIOVASCULAR: Negative for chest pain, dizziness, palpitations and pedal edema.  RESPIRATORY: Negative for recent cough and dyspnea.   MSK: see HPI INTEGUMENTARY: Negative for rash.           Objective:    PHYSICAL EXAM:   VS: BP 110/78 (BP Location: Left Arm, Patient Position: Sitting)   Pulse 86   Temp 97.8 F (36.6 C) (Temporal)   Ht 5\' 7"  (1.702 m)   Wt 290 lb (131.5 kg)   SpO2 97%   BMI 45.42 kg/m   GEN: Well nourished, well developed, in no acute distress  Cardiac: RRR; no murmurs, rubs, or gallops,no edema - MODERATE VARICOSITIES BOTH LOWER EXTREMITIES Respiratory:  normal respiratory rate and pattern with no distress - normal breath sounds with no rales, rhonchi, wheezes or rubs  MS: LEFT KNEE TENDER AND SLIGHTLY WARM -MODERATE VARICOSITIES Skin: warm and dry, no rash    Wt Readings from Last 3 Encounters:  12/10/19 290 lb (131.5 kg)  08/11/19 281 lb 9.6 oz (127.7 kg)  07/14/19 278 lb (126.1 kg)    Health Maintenance Due  Topic Date Due  . Hepatitis C Screening  Never done  . HIV Screening  Never done  . PAP SMEAR-Modifier  Never done  . MAMMOGRAM  06/14/2019    There are no preventive care reminders to display for this patient.        Assessment & Plan:   Problem List Items Addressed This Visit      Other   Acute leg pain, left - Primary    Venous doppler ordered Keep leg elevated TED hose      History of deep vein thrombosis (DVT) of lower extremity    Venous doppler ordered          No orders of the defined types were placed in this encounter.    SARA R Grayson White, PA-C

## 2019-12-10 NOTE — Assessment & Plan Note (Signed)
Venous doppler ordered Keep leg elevated TED hose

## 2019-12-11 ENCOUNTER — Other Ambulatory Visit: Payer: Self-pay | Admitting: Physician Assistant

## 2019-12-11 MED ORDER — CEPHALEXIN 500 MG PO CAPS: 500.0000 mg | ORAL_CAPSULE | Freq: Two times a day (BID) | ORAL | 0 refills | Status: DC

## 2019-12-14 ENCOUNTER — Other Ambulatory Visit: Payer: Self-pay | Admitting: Physician Assistant

## 2019-12-16 ENCOUNTER — Other Ambulatory Visit: Payer: Self-pay | Admitting: Physician Assistant

## 2019-12-20 ENCOUNTER — Other Ambulatory Visit: Payer: Self-pay | Admitting: Physician Assistant

## 2020-01-13 ENCOUNTER — Ambulatory Visit: Payer: Medicaid Other | Admitting: Physician Assistant

## 2020-02-03 ENCOUNTER — Encounter: Payer: Self-pay | Admitting: Physician Assistant

## 2020-02-03 ENCOUNTER — Other Ambulatory Visit: Payer: Self-pay

## 2020-02-03 ENCOUNTER — Ambulatory Visit (INDEPENDENT_AMBULATORY_CARE_PROVIDER_SITE_OTHER): Payer: Medicaid Other | Admitting: Physician Assistant

## 2020-02-03 VITALS — BP 132/88 | HR 80 | Temp 97.7°F | Ht 67.0 in | Wt 273.0 lb

## 2020-02-03 DIAGNOSIS — E559 Vitamin D deficiency, unspecified: Secondary | ICD-10-CM | POA: Diagnosis not present

## 2020-02-03 DIAGNOSIS — Z23 Encounter for immunization: Secondary | ICD-10-CM | POA: Diagnosis not present

## 2020-02-03 DIAGNOSIS — E782 Mixed hyperlipidemia: Secondary | ICD-10-CM | POA: Diagnosis not present

## 2020-02-03 DIAGNOSIS — R5383 Other fatigue: Secondary | ICD-10-CM | POA: Diagnosis not present

## 2020-02-03 NOTE — Progress Notes (Signed)
Established Patient Office Visit  Subjective:  Patient ID: Linda Acevedo, female    DOB: 1956-03-03  Age: 64 y.o. MRN: [id number]  CC:  Chief Complaint  Patient presents with  . Hyperlipidemia    Chronic fasting follow up    HPI Linda Acevedo presents for follow up hyperlipidemia and chronic back pain  Pt currently on pravachol 20mg  qd for hyperlipidemia - is tolerating well and voices no problems or concerns  She is currently following with [facility] for chronic low back pain- she is currently taking percocet, flexeril and nabumetone She states she has pain in her lower back with occasional pain that radiates down both legs  Pt with history of vit D def - is taking weekly supplement - due for labwork  Pt with history of anxiety and depression -is currently on cymbalta 60mg  qd Tolerating medication well  Past Medical History:  Diagnosis Date  . Anxiety   . Arthritis   . Degenerative disc disease   . Depression   . GERD (gastroesophageal reflux disease)   . Headache 10/08/2018  . Hypertension   . Morbid obesity ([place])   . Sleep apnea    uses a c-pap    Past Surgical History:  Procedure Laterality Date  . ABLATION SAPHENOUS VEIN W/ RFA     Bil  . COLONOSCOPY    . JOINT REPLACEMENT     knee/right    Family History  Problem Relation Age of Onset  . Diabetes Mother   . Cancer Father     Social History   Socioeconomic History  . Marital status: Legally Separated    Spouse name: Not on file  . Number of children: 2  . Years of education: Not on file  . Highest education level: Not on file  Occupational History  . Occupation: homemaker  Tobacco Use  . Smoking status: Never Smoker  . Smokeless tobacco: Never Used  Vaping Use  . Vaping Use: Never used  Substance and Sexual Activity  . Alcohol use: Never  . Drug use: No  . Sexual activity: Never    Partners: Male  Other Topics Concern  . Not on file  Social History Narrative    Lives with daughter [name]   Right handed    2 cups of coffee daily    Social Determinants of Health   Financial Resource Strain:   . Difficulty of Paying Living Expenses: Not on file  Food Insecurity:   . Worried About Charity fundraiser in the Last Year: Not on file  . Ran Out of Food in the Last Year: Not on file  Transportation Needs:   . Lack of Transportation (Medical): Not on file  . Lack of Transportation (Non-Medical): Not on file  Physical Activity:   . Days of Exercise per Week: Not on file  . Minutes of Exercise per Session: Not on file  Stress:   . Feeling of Stress : Not on file  Social Connections:   . Frequency of Communication with Friends and Family: Not on file  . Frequency of Social Gatherings with Friends and Family: Not on file  . Attends Religious Services: Not on file  . Active Member of Clubs or Organizations: Not on file  . Attends Archivist Meetings: Not on file  . Marital Status: Not on file  Intimate Partner Violence:   . Fear of Current or Ex-Partner: Not on file  . Emotionally Abused: Not on file  .  Physically Abused: Not on file  . Sexually Abused: Not on file     Current Outpatient Medications:  .  clotrimazole-betamethasone (LOTRISONE) cream, APPLY SMALL AMOUNT TO AFFECTED AREA 2 TIMES A DAY AS NEEDED, Disp: 45 g, Rfl: 2 .  cyclobenzaprine (FLEXERIL) 10 MG tablet, Take by mouth., Disp: , Rfl:  .  DULoxetine (CYMBALTA) 60 MG capsule, TAKE 1 CAPSULE BY MOUTH EVERY DAY, Disp: 90 capsule, Rfl: 2 .  ergocalciferol (VITAMIN D2) 1.25 MG (50000 UT) capsule, Take 50,000 Units by mouth once a week., Disp: , Rfl:  .  fluticasone (FLONASE) 50 MCG/ACT nasal spray, Place into both nostrils daily., Disp: , Rfl:  .  furosemide (LASIX) 20 MG tablet, Take 20 mg by mouth daily., Disp: , Rfl:  .  meloxicam (MOBIC) 15 MG tablet, Take 1 tablet (15 mg total) by mouth daily. One po qhs, Disp: 90 tablet, Rfl: 1 .  omeprazole (PRILOSEC) 20 MG capsule,  Take by mouth., Disp: , Rfl:  .  oxyCODONE-acetaminophen (PERCOCET) 7.5-325 MG tablet, Take by mouth., Disp: , Rfl:  .  pravastatin (PRAVACHOL) 20 MG tablet, TAKE 1 TABLET BY MOUTH EVERY DAY, Disp: 30 tablet, Rfl: 5   Allergies  Allergen Reactions  . Gabapentin Swelling and Anaphylaxis    Face swelling    ROS CONSTITUTIONAL: Negative for chills, fatigue, fever, unintentional weight gain and unintentional weight loss.  E/N/T: Negative for ear pain, nasal congestion and sore throat.  CARDIOVASCULAR: Negative for chest pain, dizziness, palpitations and pedal edema.  RESPIRATORY: Negative for recent cough and dyspnea.  GASTROINTESTINAL: Negative for abdominal pain, acid reflux symptoms, constipation, diarrhea, nausea and vomiting.  MSK: see HPI.  INTEGUMENTARY: Negative for rash.  NEUROLOGICAL: Negative for dizziness and headaches.  PSYCHIATRIC: Negative for sleep disturbance and to question depression screen.  Negative for depression, negative for anhedonia.        Objective:    PHYSICAL EXAM:   VS: BP 132/88 (BP Location: Left Arm, Patient Position: Sitting)   Pulse 80   Temp 97.7 F (36.5 C) (Temporal)   Ht 5\' 7"  (1.702 m)   Wt 273 lb (123.8 kg)   SpO2 100%   BMI 42.76 kg/m   GEN: Well nourished, well developed, in no acute distress    Cardiac: RRR; no murmurs, rubs, or gallops,no edema - no significant varicosities Respiratory:  normal respiratory rate and pattern with no distress - normal breath sounds with no rales, rhonchi, wheezes or rubs Skin: warm and dry, no rash  Neuro:  Alert and Oriented x 3, Strength and sensation are intact - CN II-Xii grossly intact Psych: euthymic mood, appropriate affect and demeanor  BP 132/88 (BP Location: Left Arm, Patient Position: Sitting)   Pulse 80   Temp 97.7 F (36.5 C) (Temporal)   Ht 5\' 7"  (1.702 m)   Wt 273 lb (123.8 kg)   SpO2 100%   BMI 42.76 kg/m  Wt Readings from Last 3 Encounters:  02/03/20 273 lb (123.8 kg)   12/10/19 290 lb (131.5 kg)  08/11/19 281 lb 9.6 oz (127.7 kg)     Health Maintenance Due  Topic Date Due  . PAP SMEAR-Modifier  Never done  . MAMMOGRAM  06/14/2019  . INFLUENZA VACCINE  12/21/2019    There are no preventive care reminders to display for this patient.  Lab Results  Component Value Date   TSH 4.010 07/14/2019   Lab Results  Component Value Date   WBC 7.6 07/14/2019   HGB 15.1 07/14/2019  HCT 45.8 07/14/2019   MCV 93 07/14/2019   PLT 310 07/14/2019   Lab Results  Component Value Date   NA 144 07/14/2019   K 4.7 07/14/2019   CO2 25 07/14/2019   GLUCOSE 83 07/14/2019   BUN 14 07/14/2019   CREATININE 0.87 07/14/2019   BILITOT 0.3 07/14/2019   ALKPHOS 117 07/14/2019   AST 13 07/14/2019   ALT 14 07/14/2019   PROT 6.9 07/14/2019   ALBUMIN 3.9 07/14/2019   CALCIUM 9.3 07/14/2019   Lab Results  Component Value Date   CHOL 214 (H) 07/14/2019   Lab Results  Component Value Date   HDL 77 07/14/2019   Lab Results  Component Value Date   LDLCALC 124 (H) 07/14/2019   Lab Results  Component Value Date   TRIG 71 07/14/2019   Lab Results  Component Value Date   CHOLHDL 2.8 07/14/2019   No results found for: HGBA1C    Assessment & Plan:   Problem List Items Addressed This Visit      Other   Vitamin D deficiency   Relevant Orders   VITAMIN D 25 Hydroxy (Vit-D Deficiency, Fractures)   Mixed hyperlipidemia - Primary   Relevant Orders   Lipid panel   Other fatigue   Relevant Orders   CBC with Differential/Platelet   Comprehensive metabolic panel   TSH   Need for prophylactic vaccination and inoculation against influenza   Relevant Orders   Flu Vaccine MDCK QUAD PF      No orders of the defined types were placed in this encounter.   Follow-up: Return in about 6 months (around 08/02/2020) for chronic fasting follow up.    SARA R Trystian Crisanto, PA-C

## 2020-02-04 ENCOUNTER — Other Ambulatory Visit: Payer: Self-pay | Admitting: Physician Assistant

## 2020-02-04 LAB — COMPREHENSIVE METABOLIC PANEL
ALT: 16 IU/L (ref 0–32)
AST: 20 IU/L (ref 0–40)
Albumin/Globulin Ratio: 1.6 (ref 1.2–2.2)
Albumin: 4.2 g/dL (ref 3.8–4.8)
Alkaline Phosphatase: 116 IU/L (ref 44–121)
BUN/Creatinine Ratio: 13 (ref 12–28)
BUN: 10 mg/dL (ref 8–27)
Bilirubin Total: 0.4 mg/dL (ref 0.0–1.2)
CO2: 27 mmol/L (ref 20–29)
Calcium: 9.4 mg/dL (ref 8.7–10.3)
Chloride: 102 mmol/L (ref 96–106)
Creatinine, Ser: 0.79 mg/dL (ref 0.57–1.00)
GFR calc Af Amer: 91 mL/min/{1.73_m2} (ref >59)
GFR calc non Af Amer: 79 mL/min/{1.73_m2} (ref >59)
Globulin, Total: 2.6 g/dL (ref 1.5–4.5)
Glucose: 96 mg/dL (ref 65–99)
Potassium: 4.6 mmol/L (ref 3.5–5.2)
Sodium: 136 mmol/L (ref 134–144)
Total Protein: 6.8 g/dL (ref 6.0–8.5)

## 2020-02-04 LAB — CBC WITH DIFFERENTIAL/PLATELET
Basophils Absolute: 0 10*3/uL (ref 0.0–0.2)
Basos: 0 %
EOS (ABSOLUTE): 0.1 10*3/uL (ref 0.0–0.4)
Eos: 2 %
Hematocrit: 45.6 % (ref 34.0–46.6)
Hemoglobin: 15.1 g/dL (ref 11.1–15.9)
Immature Grans (Abs): 0 10*3/uL (ref 0.0–0.1)
Immature Granulocytes: 0 %
Lymphocytes Absolute: 2 10*3/uL (ref 0.7–3.1)
Lymphs: 36 %
MCH: 31 pg (ref 26.6–33.0)
MCHC: 33.1 g/dL (ref 31.5–35.7)
MCV: 94 fL (ref 79–97)
Monocytes Absolute: 0.3 10*3/uL (ref 0.1–0.9)
Monocytes: 6 %
Neutrophils Absolute: 3 10*3/uL (ref 1.4–7.0)
Neutrophils: 56 %
Platelets: 262 10*3/uL (ref 150–450)
RBC: 4.87 x10E6/uL (ref 3.77–5.28)
RDW: 13.1 % (ref 11.7–15.4)
WBC: 5.5 10*3/uL (ref 3.4–10.8)

## 2020-02-04 LAB — LIPID PANEL
Chol/HDL Ratio: 4 ratio (ref 0.0–4.4)
Cholesterol, Total: 220 mg/dL — ABNORMAL HIGH (ref 100–199)
HDL: 55 mg/dL (ref >39)
LDL Chol Calc (NIH): 140 mg/dL — ABNORMAL HIGH (ref 0–99)
Triglycerides: 138 mg/dL (ref 0–149)
VLDL Cholesterol Cal: 25 mg/dL (ref 5–40)

## 2020-02-04 LAB — VITAMIN D 25 HYDROXY (VIT D DEFICIENCY, FRACTURES): Vit D, 25-Hydroxy: 28.2 ng/mL — ABNORMAL LOW (ref 30.0–100.0)

## 2020-02-04 LAB — CARDIOVASCULAR RISK ASSESSMENT

## 2020-02-04 LAB — TSH: TSH: 2.11 u[IU]/mL (ref 0.450–4.500)

## 2020-02-04 MED ORDER — PRAVASTATIN SODIUM 40 MG PO TABS
40.0000 mg | ORAL_TABLET | Freq: Every day | ORAL | 1 refills | Status: DC
Start: 2020-02-04 — End: 2020-08-30

## 2020-02-06 IMAGING — CT CT CHEST W/O CM
2 of 4 series · 13 of 36 positions shown, 16 images · non-contrast
Comparison: 03/19/2018

CLINICAL DATA: Follow-up thoracic aortic aneurysm.

EXAM:
CT CHEST WITHOUT CONTRAST
TECHNIQUE: Multidetector CT imaging of the chest was performed following the
standard protocol without IV contrast.

[Series 2: chest 2.00 br40 s3 · axial · 0.65mm/px · z∈[+1751,+1993]mm · 10 of 143 slices shown, 13 images (1 of 2)]
[im 11/143  mediastinal]
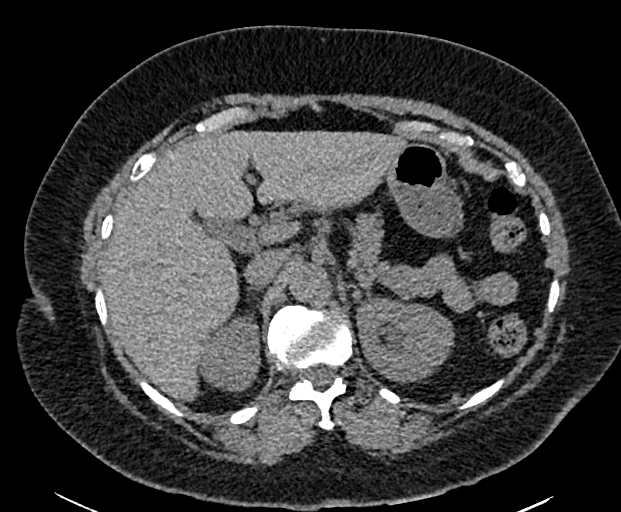
[im 11/143  lung]
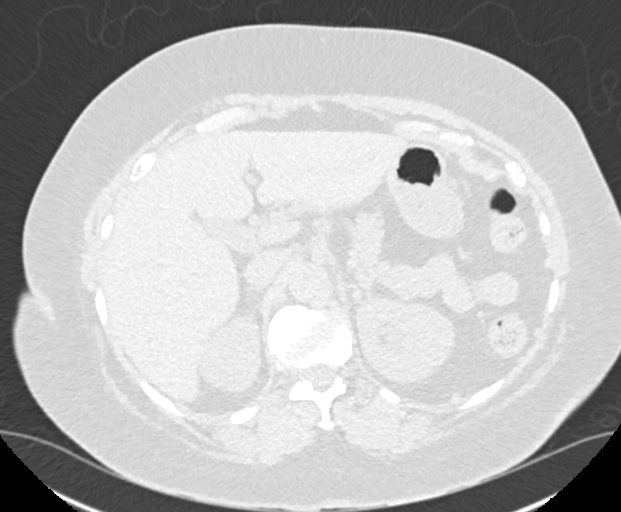
[im 22/143  lung]
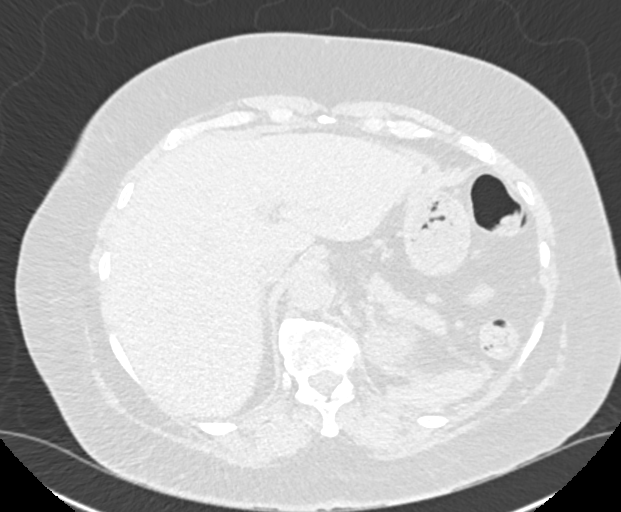
[im 44/143  lung]
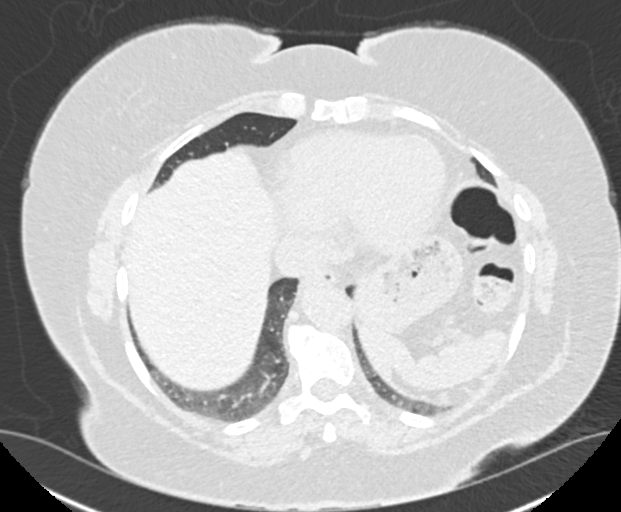
[im 55/143  lung]
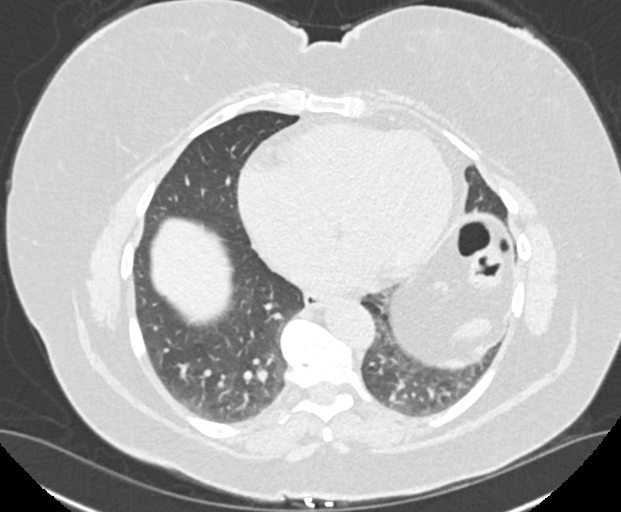
[im 66/143  mediastinal]
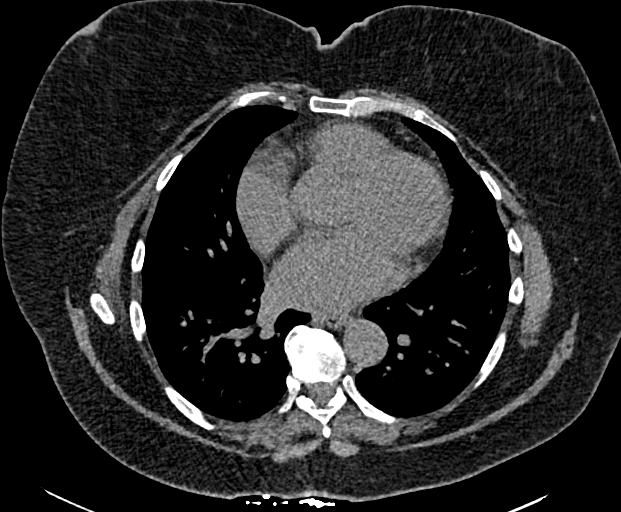
[im 66/143  lung]
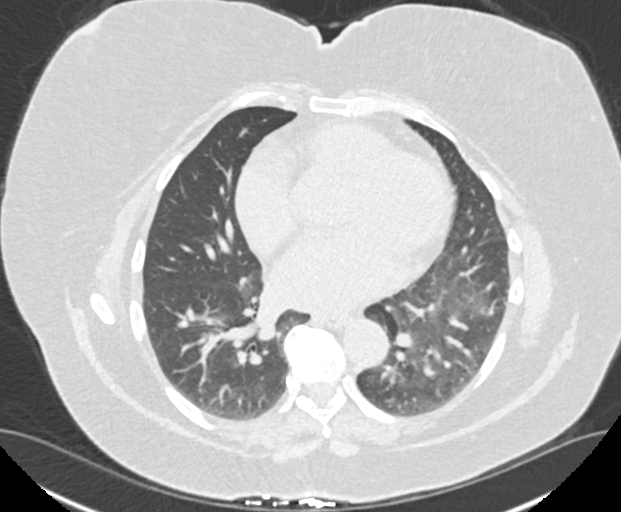
[im 77/143  lung]
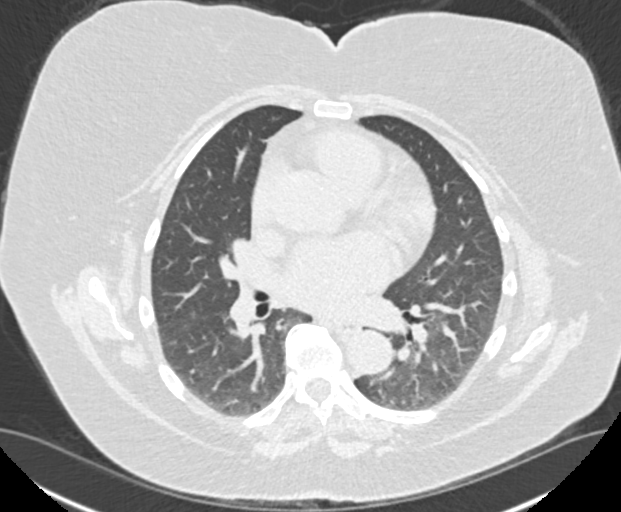
[im 88/143  lung]
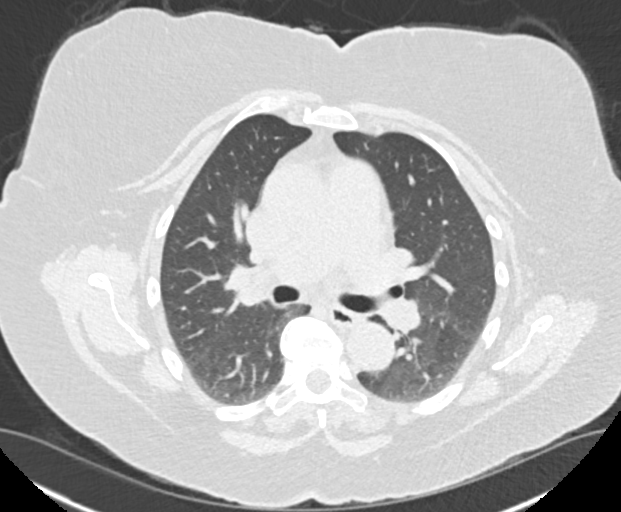
[im 110/143  lung]
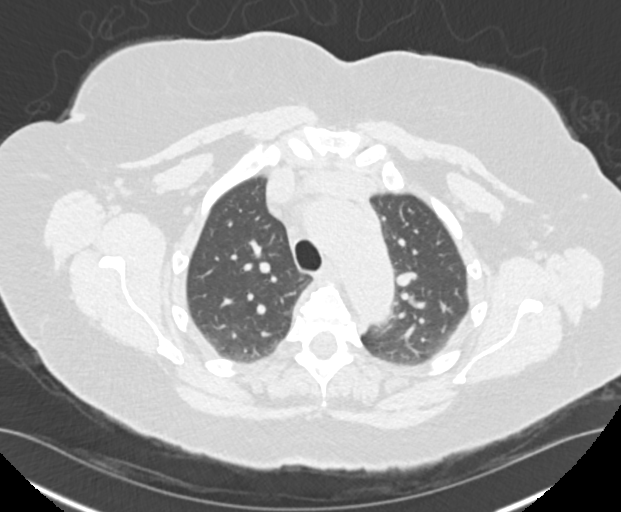
[im 121/143  mediastinal]
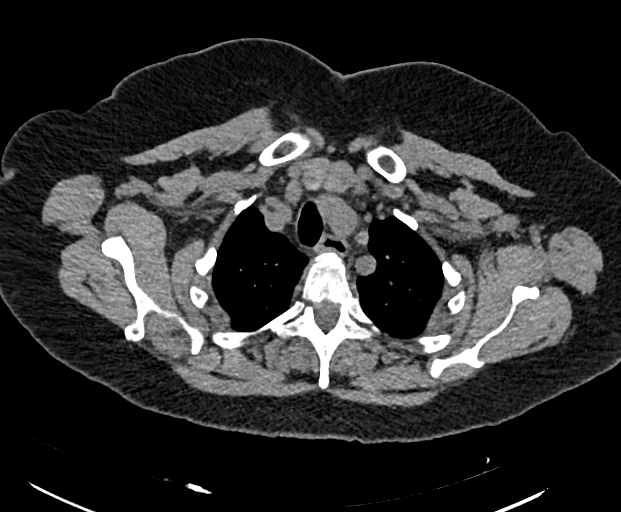
[im 121/143  lung]
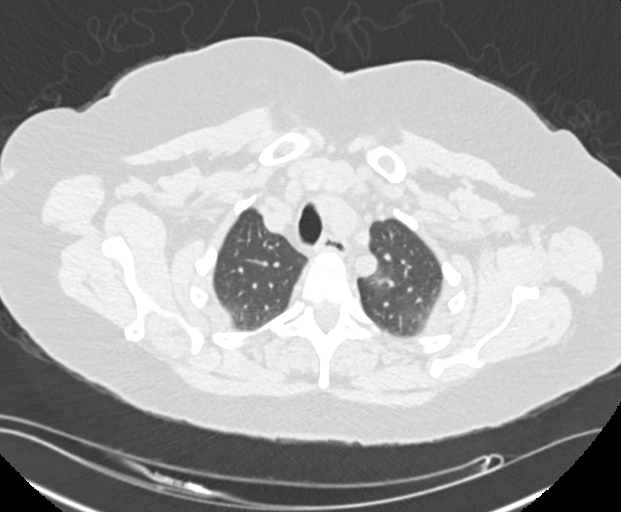
[im 132/143  lung]
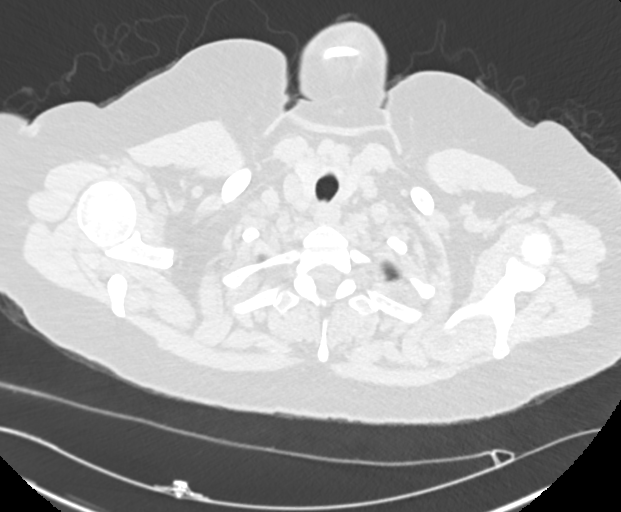

[Series 4: chest 2.00 br40 s3 · coronal · 0.56mm/px · 3 of 166 slices shown (2 of 2)]
[im 34/166  lung]
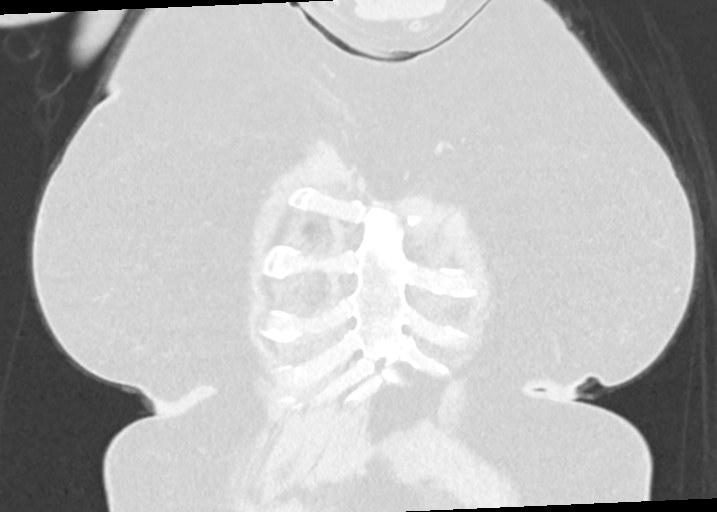
[im 67/166  lung]
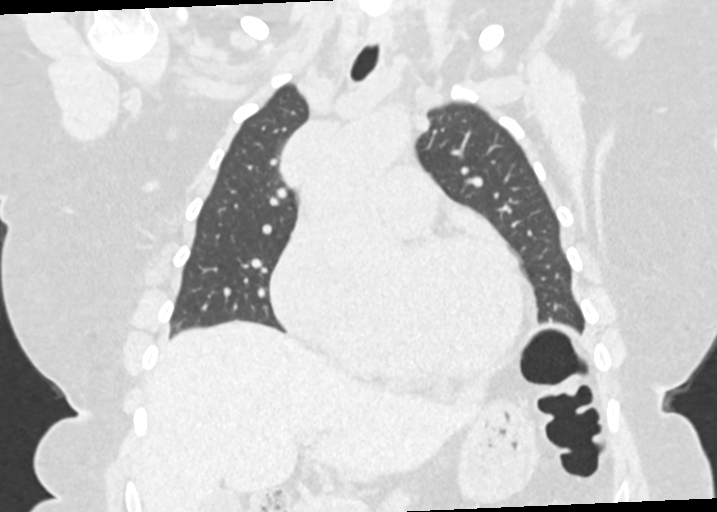
[im 100/166  lung]
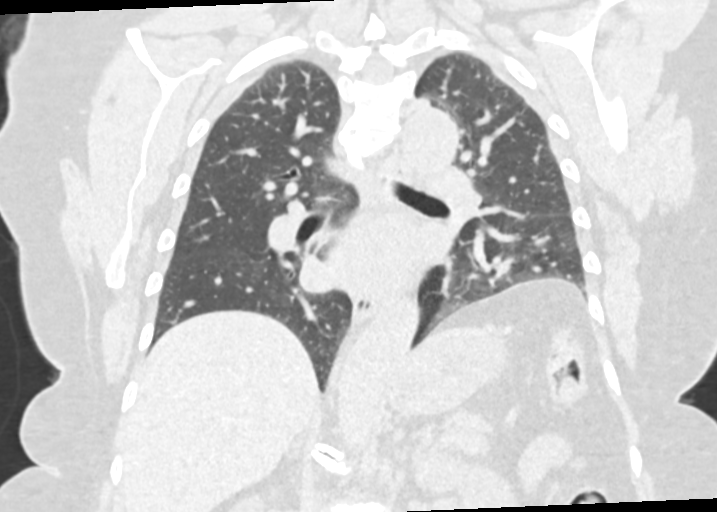

[13 of 36 positions shown; findings below may reference images not displayed]

FINDINGS: Cardiovascular: 4.1 cm ascending thoracic aortic aneurysm is stable
since previous study. No evidence of mediastinal hematoma or
pericardial effusion.

Mediastinum/Nodes: No masses or pathologically enlarged lymph nodes
identified on this unenhanced exam.

Lungs/Pleura: No pulmonary infiltrate or mass identified. No
effusion present.

Upper Abdomen:  Unremarkable.

Musculoskeletal:  No suspicious bone lesions.
IMPRESSION: Stable 4.1 cm ascending thoracic aortic aneurysm. Recommend annual
imaging followup by CTA or MRA. This recommendation follows 9151
ACCF/AHA/AATS/ACR/ASA/SCA/RAAFAT/FATIC/VB/LOMELIN Guidelines for the
Diagnosis and Management of Patients with Thoracic Aortic Disease.
Circulation. 9151; 121: E266-e369. Aortic aneurysm NOS ([id number].E)

## 2020-02-09 ENCOUNTER — Other Ambulatory Visit: Payer: Self-pay | Admitting: Physician Assistant

## 2020-02-09 MED ORDER — ERGOCALCIFEROL 1.25 MG (50000 UT) PO CAPS: 50000.0000 [IU] | ORAL_CAPSULE | ORAL | 1 refills | Status: DC

## 2020-02-10 ENCOUNTER — Other Ambulatory Visit: Payer: Self-pay | Admitting: Thoracic Surgery (Cardiothoracic Vascular Surgery)

## 2020-02-10 DIAGNOSIS — I712 Thoracic aortic aneurysm, without rupture, unspecified: Secondary | ICD-10-CM

## 2020-03-08 ENCOUNTER — Ambulatory Visit
Admission: RE | Admit: 2020-03-08 | Discharge: 2020-03-08 | Disposition: A | Discharge status: Home / Self Care | Payer: Medicaid Other | Source: Ambulatory Visit | Attending: Thoracic Surgery (Cardiothoracic Vascular Surgery) | Admitting: Thoracic Surgery (Cardiothoracic Vascular Surgery)

## 2020-03-08 DIAGNOSIS — I712 Thoracic aortic aneurysm, without rupture, unspecified: Secondary | ICD-10-CM

## 2020-03-08 MED ORDER — IOPAMIDOL (ISOVUE-370) INJECTION 76%
75.0000 mL | Freq: Once | INTRAVENOUS | Status: AC | PRN
  Administered 2020-03-08: 75 mL via INTRAVENOUS

## 2020-03-12 ENCOUNTER — Other Ambulatory Visit: Payer: Self-pay | Admitting: Physician Assistant

## 2020-03-16 ENCOUNTER — Other Ambulatory Visit: Payer: Self-pay

## 2020-03-16 ENCOUNTER — Ambulatory Visit (INDEPENDENT_AMBULATORY_CARE_PROVIDER_SITE_OTHER): Payer: Medicaid Other | Admitting: Thoracic Surgery (Cardiothoracic Vascular Surgery)

## 2020-03-16 ENCOUNTER — Encounter: Payer: Self-pay | Admitting: Thoracic Surgery (Cardiothoracic Vascular Surgery)

## 2020-03-16 VITALS — BP 125/83 | HR 86 | Resp 20 | Ht 67.0 in | Wt 278.0 lb

## 2020-03-16 DIAGNOSIS — I712 Thoracic aortic aneurysm, without rupture, unspecified: Secondary | ICD-10-CM

## 2020-03-16 DIAGNOSIS — I77810 Thoracic aortic ectasia: Secondary | ICD-10-CM | POA: Diagnosis not present

## 2020-03-16 NOTE — Progress Notes (Signed)
[address] HeightsSuite [address]       [place], [address]             [phone]     HPI: Linda Acevedo returns for scheduled annual follow-up visit regarding her ascending aneurysm  She speaks limited English and is accompanied by her daughter.  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  In June 2018 she had a knee replacement.  A couple of months later she developed chest pain and shortness of breath.  A CT showed pulmonary emboli.  She was also noted to have a 4 cm ascending aneurysm.  I last saw her in October 2020.  She was doing well at that time.  The aneurysm was stable  Over the past year she has not had any chest pain, pressure, or tightness.  She does have chronic back and hip pain and is on oxycodone for that.  She also takes Flexeril and Mobic.  She has some chronic swelling in her legs but nothing is changed with that recently.   Past Medical History:  Diagnosis Date  . Anxiety   . Arthritis   . Degenerative disc disease   . Depression   . GERD (gastroesophageal reflux disease)   . Headache 10/08/2018  . Hypertension   . Morbid obesity ([place])   . Sleep apnea    uses a c-pap    Current Outpatient Medications  Medication Sig Dispense Refill  . clotrimazole-betamethasone (LOTRISONE) cream APPLY SMALL AMOUNT TO AFFECTED AREA 2 TIMES A DAY AS NEEDED 45 g 2  . cyclobenzaprine (FLEXERIL) 10 MG tablet Take by mouth.    . DULoxetine (CYMBALTA) 60 MG capsule TAKE 1 CAPSULE BY MOUTH EVERY DAY 90 capsule 2  . ergocalciferol (VITAMIN D2) 1.25 MG (50000 UT) capsule Take 1 capsule (50,000 Units total) by mouth once a week. 12 capsule 1  . fluticasone (FLONASE) 50 MCG/ACT nasal spray SPRAY 2 SPRAYS INTO EACH NOSTRIL EVERY DAY 16 mL 5  . furosemide (LASIX) 20 MG tablet Take 20 mg by mouth daily.    .  meloxicam (MOBIC) 15 MG tablet TAKE 1 TABLET (15 MG TOTAL) AT BEDTIME 90 tablet 1  . omeprazole (PRILOSEC) 20 MG capsule Take by mouth.    . oxyCODONE-acetaminophen (PERCOCET) 7.5-325 MG tablet Take by mouth.    . pravastatin (PRAVACHOL) 40 MG tablet Take 1 tablet (40 mg total) by mouth daily. 90 tablet 1   No current facility-administered medications for this visit.    Physical Exam BP 125/83 (BP Location: Right Arm, Patient Position: Sitting)   Pulse 86   Resp 20   Ht 5\' 7"  (1.702 m)   Wt 278 lb (126.1 kg)   SpO2 93% Comment: RA with mask on  BMI 43.81 kg/m  64 year old woman in no acute distress Alert and oriented x3 with no focal motor deficit Obese Cardiac regular rate and rhythm with normal S1 and S2 Lungs clear bilaterally  Diagnostic Tests: CT ANGIOGRAPHY CHEST WITH CONTRAST  TECHNIQUE: Multidetector CT imaging of the chest was performed using the standard protocol during bolus administration of intravenous contrast. Multiplanar CT image reconstructions and MIPs were obtained to evaluate the vascular anatomy.  CONTRAST:  58mL ISOVUE-370 IOPAMIDOL (ISOVUE-370) INJECTION 76%  COMPARISON:  CT chest dated March 13, 2019.  FINDINGS: Cardiovascular: Unchanged 4.1 cm ascending thoracic aortic aneurysm. No dissection.  Normal heart size. No pericardial effusion. No central pulmonary embolism.  Mediastinum/Nodes: No enlarged mediastinal, hilar, or axillary lymph nodes. Thyroid gland, trachea, and esophagus demonstrate no significant findings.  Lungs/Pleura: Lungs are clear. No pleural effusion or pneumothorax.  Upper Abdomen: No acute abnormality.  Musculoskeletal: No chest wall abnormality. No acute or significant osseous findings.  Review of the MIP images confirms the above findings.  IMPRESSION: 1. Unchanged 4.1 cm ascending thoracic aortic aneurysm. Recommend annual imaging followup by CTA or MRA. This recommendation follows 2010  ACCF/AHA/AATS/ACR/ASA/SCA/SCAI/SIR/STS/SVM Guidelines for the Diagnosis and Management of Patients with Thoracic Aortic Disease. Circulation. 2010; 121: N300-F110. Aortic aneurysm NOS (ICD10-I71.9)   Electronically Signed   By: Titus Dubin M.D.   On: 03/08/2020 12:38 I personally reviewed the CT images and concur with the findings noted above  Impression: Linda Acevedo is a 64 year old Hispanic woman with a past history significant for hypertension, obesity, degenerative joint disease, pulmonary embolism following knee replacement, an ascending aneurysm, and chronic back and hip pain.  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  She has been followed since that time.  There is been no interval change in her aneurysm over the past year.  Needs continued annual follow-up.  Hypertension-blood pressure well controlled  Plan: Return in 1 year with MR angiogram of chest  I spent over 10 minutes in review of images, records, and in consultation with Linda Acevedo today.  Melrose Nakayama, MD [facility] [phone]

## 2020-05-02 ENCOUNTER — Other Ambulatory Visit: Payer: Self-pay | Admitting: Physician Assistant

## 2020-06-14 ENCOUNTER — Other Ambulatory Visit: Payer: Self-pay | Admitting: Physician Assistant

## 2020-06-14 ENCOUNTER — Encounter: Payer: Self-pay | Admitting: Nurse Practitioner

## 2020-06-14 ENCOUNTER — Telehealth (INDEPENDENT_AMBULATORY_CARE_PROVIDER_SITE_OTHER): Payer: Self-pay | Admitting: Nurse Practitioner

## 2020-06-14 VITALS — BP 109/76 | Temp 97.4°F | Ht 67.0 in | Wt 265.0 lb

## 2020-06-14 DIAGNOSIS — R0600 Dyspnea, unspecified: Secondary | ICD-10-CM

## 2020-06-14 DIAGNOSIS — J019 Acute sinusitis, unspecified: Secondary | ICD-10-CM

## 2020-06-14 MED ORDER — ALBUTEROL SULFATE HFA 108 (90 BASE) MCG/ACT IN AERS: 2.0000 | INHALATION_SPRAY | Freq: Four times a day (QID) | RESPIRATORY_TRACT | 0 refills | Status: DC | PRN

## 2020-06-14 MED ORDER — AZITHROMYCIN 250 MG PO TABS: ORAL_TABLET | ORAL | 0 refills | Status: DC

## 2020-06-14 NOTE — Progress Notes (Signed)
Virtual Visit via Telephone Note   This visit type was conducted due to national recommendations for restrictions regarding the COVID-19 Pandemic (e.g. social distancing) in an effort to limit this patient's exposure and mitigate transmission in our community.  Due to her co-morbid illnesses, this patient is at least at moderate risk for complications without adequate follow up.  This format is felt to be most appropriate for this patient at this time.  The patient did not have access to video technology/had technical difficulties with video requiring transitioning to audio format only (telephone).  All issues noted in this document were discussed and addressed.  No physical exam could be performed with this format.  Patient verbally consented to a telehealth visit.   Date:  06/14/2020   ID:  Linda Acevedo, DOB [birth year], MRN [id number]  Patient Location: Home Provider Location: Office/Clinic  PCP:  Linda Duncans, PA-C   Evaluation Performed:  Established patient, acute telemedicine visit  Chief Complaint:  Sore throat  History of Present Illness:    Linda Acevedo is a 65 y.o. female with sore throat, sinus congestion, bilateral ear pain, and dyspnea with activity. Her daughter, Linda Acevedo, was interpretor on the telephone due to language barrier.  Onset of symptoms was two days ago. Treatment includes OTC cold remedies Robitussin Cold and Flu. She has experienced dyspnea with previous URI in the past that required inhaler. She is having COVID-19 test at [facility] this afternoon.   The patient does have symptoms concerning for COVID-19 infection (fever, chills, cough, or new shortness of breath).    Past Medical History:  Diagnosis Date  . Anxiety   . Arthritis   . Degenerative disc disease   . Depression   . GERD (gastroesophageal reflux disease)   . Headache 10/08/2018  . Hypertension   . Morbid obesity ([place])   . Sleep apnea    uses a c-pap    Past  Surgical History:  Procedure Laterality Date  . ABLATION SAPHENOUS VEIN W/ RFA     Bil  . COLONOSCOPY    . JOINT REPLACEMENT     knee/right    Family History  Problem Relation Age of Onset  . Diabetes Mother   . Cancer Father     Social History   Socioeconomic History  . Marital status: Legally Separated    Spouse name: Not on file  . Number of children: 2  . Years of education: Not on file  . Highest education level: Not on file  Occupational History  . Occupation: homemaker  Tobacco Use  . Smoking status: Never Smoker  . Smokeless tobacco: Never Used  Vaping Use  . Vaping Use: Never used  Substance and Sexual Activity  . Alcohol use: Never  . Drug use: No  . Sexual activity: Never    Partners: Male  Other Topics Concern  . Not on file  Social History Narrative   Lives with daughter Linda Acevedo   Right handed    2 cups of coffee daily    Social Determinants of Health   Financial Resource Strain: Not on file  Food Insecurity: Not on file  Transportation Needs: Not on file  Physical Activity: Not on file  Stress: Not on file  Social Connections: Not on file  Intimate Partner Violence: Not on file    Outpatient Medications Prior to Visit  Medication Sig Dispense Refill  . clotrimazole-betamethasone (LOTRISONE) cream APPLY SMALL AMOUNT TO AFFECTED AREA 2 TIMES A DAY AS NEEDED 45  g 2  . cyclobenzaprine (FLEXERIL) 10 MG tablet Take by mouth.    . DULoxetine (CYMBALTA) 60 MG capsule TAKE 1 CAPSULE BY MOUTH EVERY DAY 90 capsule 2  . ergocalciferol (VITAMIN D2) 1.25 MG (50000 UT) capsule Take 1 capsule (50,000 Units total) by mouth once a week. 12 capsule 1  . fluticasone (FLONASE) 50 MCG/ACT nasal spray SPRAY 2 SPRAYS INTO EACH NOSTRIL EVERY DAY 16 mL 5  . furosemide (LASIX) 20 MG tablet TAKE 1 TABLET BY MOUTH EVERY DAY AS DIRECTED 30 tablet 2  . meloxicam (MOBIC) 15 MG tablet TAKE 1 TABLET (15 MG TOTAL) AT BEDTIME 90 tablet 1  . omeprazole (PRILOSEC) 20 MG capsule  Take by mouth.    . oxyCODONE-acetaminophen (PERCOCET) 7.5-325 MG tablet Take by mouth.    . pravastatin (PRAVACHOL) 40 MG tablet Take 1 tablet (40 mg total) by mouth daily. 90 tablet 1   No facility-administered medications prior to visit.    Allergies:   Gabapentin   Social History   Tobacco Use  . Smoking status: Never Smoker  . Smokeless tobacco: Never Used  Vaping Use  . Vaping Use: Never used  Substance Use Topics  . Alcohol use: Never  . Drug use: No     Review of Systems  Constitutional: Positive for malaise/fatigue. Negative for chills and fever.  HENT: Positive for congestion, ear pain and sore throat.   Respiratory: Positive for cough and shortness of breath. Negative for wheezing.   Cardiovascular: Negative for chest pain.  Gastrointestinal: Positive for diarrhea. Negative for abdominal pain, constipation, nausea and vomiting.  Genitourinary: Negative for dysuria, frequency and urgency.  Musculoskeletal: Negative for joint pain and myalgias.  Neurological: Negative for dizziness and headaches.     Labs/Other Tests and Data Reviewed:    Recent Labs: 02/03/2020: ALT 16; BUN 10; Creatinine, Ser 0.79; Hemoglobin 15.1; Platelets 262; Potassium 4.6; Sodium 136; TSH 2.110   Recent Lipid Panel Lab Results  Component Value Date/Time   CHOL 220 (H) 02/03/2020 11:35 AM   TRIG 138 02/03/2020 11:35 AM   HDL 55 02/03/2020 11:35 AM   CHOLHDL 4.0 02/03/2020 11:35 AM   LDLCALC 140 (H) 02/03/2020 11:35 AM    Wt Readings from Last 3 Encounters:  06/14/20 265 lb (120.2 kg)  03/16/20 278 lb (126.1 kg)  02/03/20 273 lb (123.8 kg)     Objective:    Vital Signs:  BP 109/76   Temp (!) 97.4 F (36.3 C)   Ht 5\' 7"  (1.702 m)   Wt 265 lb (120.2 kg)   BMI 41.50 kg/m    Physical Exam No physical exam performed due to telemedicine visit  ASSESSMENT & PLAN:    1. Acute non-recurrent sinusitis, unspecified location - azithromycin (ZITHROMAX) 250 MG tablet; Take two  tablets by mouth on day one, take one tablet by mouth on days two-five  Dispense: 6 tablet; Refill: 0  2. Dyspnea, unspecified type - albuterol (VENTOLIN HFA) 108 (90 Base) MCG/ACT inhaler; Inhale 2 puffs into the lungs every 6 (six) hours as needed for wheezing or shortness of breath.  Dispense: 8 g; Refill: 0  Rest and push fluids Seek emergency care if symptoms become severe Notify office if symptoms fail to improve or worsen  COVID-19 Education: The signs and symptoms of COVID-19 were discussed with the patient and how to seek care for testing (follow up with PCP or arrange E-visit). The importance of social distancing was discussed today.   I spent 10 minutes dedicated  to the care of this patient on the date of this encounter to include telephone time with the patient, as well as:EMR review and prescription medication management.  Follow Up:  Virtual Visit  prn  Varney Baas, DNP  06/14/2020 1:56 p.m.    [place]

## 2020-06-23 ENCOUNTER — Telehealth (INDEPENDENT_AMBULATORY_CARE_PROVIDER_SITE_OTHER): Payer: 59 | Admitting: Physician Assistant

## 2020-06-23 ENCOUNTER — Encounter: Payer: Self-pay | Admitting: Physician Assistant

## 2020-06-23 VITALS — Temp 97.2°F | Wt 267.0 lb

## 2020-06-23 DIAGNOSIS — J060 Acute laryngopharyngitis: Secondary | ICD-10-CM

## 2020-06-23 MED ORDER — GUAIFENESIN-CODEINE 100-10 MG/5ML PO SOLN: 10.0000 mL | Freq: Four times a day (QID) | ORAL | 0 refills | Status: DC | PRN

## 2020-06-23 MED ORDER — AMOXICILLIN-POT CLAVULANATE 875-125 MG PO TABS: 1.0000 | ORAL_TABLET | Freq: Two times a day (BID) | ORAL | 0 refills | Status: DC

## 2020-06-23 NOTE — Progress Notes (Signed)
Virtual Visit via Telephone Note   This visit type was conducted due to national recommendations for restrictions regarding the COVID-19 Pandemic (e.g. social distancing) in an effort to limit this patient's exposure and mitigate transmission in our community.  Due to her co-morbid illnesses, this patient is at least at moderate risk for complications without adequate follow up.  This format is felt to be most appropriate for this patient at this time.  The patient did not have access to video technology/had technical difficulties with video requiring transitioning to audio format only (telephone).  All issues noted in this document were discussed and addressed.  No physical exam could be performed with this format.  Patient verbally consented to a telehealth visit.   Date:  06/23/2020   ID:  Linda Acevedo, DOB 01-Jan-1956, MRN [id number]  Patient Location: Home Provider Location: Office  PCP:  Marge Duncans, PA-C    Chief Complaint:  URI/cough  History of Present Illness:    Linda Acevedo is a 65 y.o. female with continued coughing and scratchy throat after being diagnosed with COVID and uri - still has a mild headache but no fever or bodyaches - does have a ventolin inhaler to use as needed and just finished zpack  Denies dyspnea or chest pain  The patient does have symptoms concerning for COVID-19 infection (fever, chills, cough, or new shortness of breath).    Past Medical History:  Diagnosis Date  . Anxiety   . Arthritis   . Degenerative disc disease   . Depression   . GERD (gastroesophageal reflux disease)   . Headache 10/08/2018  . Hypertension   . Morbid obesity ([place])   . Sleep apnea    uses a c-pap   Past Surgical History:  Procedure Laterality Date  . ABLATION SAPHENOUS VEIN W/ RFA     Bil  . COLONOSCOPY    . JOINT REPLACEMENT     knee/right     Current Meds  Medication Sig  . albuterol (VENTOLIN HFA) 108 (90 Base) MCG/ACT inhaler Inhale 2 puffs  into the lungs every 6 (six) hours as needed for wheezing or shortness of breath.  Marland Kitchen amoxicillin-clavulanate (AUGMENTIN) 875-125 MG tablet Take 1 tablet by mouth 2 (two) times daily.  . clotrimazole-betamethasone (LOTRISONE) cream APPLY SMALL AMOUNT TO AFFECTED AREA 2 TIMES A DAY AS NEEDED  . cyclobenzaprine (FLEXERIL) 10 MG tablet Take by mouth.  . DULoxetine (CYMBALTA) 60 MG capsule TAKE 1 CAPSULE BY MOUTH EVERY DAY  . ergocalciferol (VITAMIN D2) 1.25 MG (50000 UT) capsule Take 1 capsule (50,000 Units total) by mouth once a week.  . fluticasone (FLONASE) 50 MCG/ACT nasal spray SPRAY 2 SPRAYS INTO EACH NOSTRIL EVERY DAY  . furosemide (LASIX) 20 MG tablet TAKE 1 TABLET BY MOUTH EVERY DAY AS DIRECTED  . guaiFENesin-codeine 100-10 MG/5ML syrup Take 10 mLs by mouth every 6 (six) hours as needed for cough.  . meloxicam (MOBIC) 15 MG tablet TAKE 1 TABLET (15 MG TOTAL) AT BEDTIME  . omeprazole (PRILOSEC) 20 MG capsule Take by mouth.  . oxyCODONE-acetaminophen (PERCOCET) 7.5-325 MG tablet Take by mouth.  . pravastatin (PRAVACHOL) 40 MG tablet Take 1 tablet (40 mg total) by mouth daily.     Allergies:   Gabapentin   Social History   Tobacco Use  . Smoking status: Never Smoker  . Smokeless tobacco: Never Used  Vaping Use  . Vaping Use: Never used  Substance Use Topics  . Alcohol use: Never  . Drug use:  No     Family Hx: The patient's family history includes Cancer in her father; Diabetes in her mother.  ROS:   Please see the history of present illness.    All other systems reviewed and are negative.  Labs/Other Tests and Data Reviewed:    Recent Labs: 02/03/2020: ALT 16; BUN 10; Creatinine, Ser 0.79; Hemoglobin 15.1; Platelets 262; Potassium 4.6; Sodium 136; TSH 2.110   Recent Lipid Panel Lab Results  Component Value Date/Time   CHOL 220 (H) 02/03/2020 11:35 AM   TRIG 138 02/03/2020 11:35 AM   HDL 55 02/03/2020 11:35 AM   CHOLHDL 4.0 02/03/2020 11:35 AM   LDLCALC 140 (H)  02/03/2020 11:35 AM    Wt Readings from Last 3 Encounters:  06/23/20 267 lb (121.1 kg)  06/14/20 265 lb (120.2 kg)  03/16/20 278 lb (126.1 kg)     Objective:    Vital Signs:  Temp (!) 97.2 F (36.2 C)   Wt 267 lb (121.1 kg)   BMI 41.82 kg/m    VITAL SIGNS:  reviewed  ASSESSMENT & PLAN:    1. URI - rx for augmentin and robitussin - use inhaler as directed - follow up if symptoms change or worsen  COVID-19 Education: The signs and symptoms of COVID-19 were discussed with the patient and how to seek care for testing (follow up with PCP or arrange E-visit). The importance of social distancing was discussed today.  Time:   Today, I have spent 10 minutes with the patient with telehealth technology discussing the above problems.     Medication Adjustments/Labs and Tests Ordered: Current medicines are reviewed at length with the patient today.  Concerns regarding medicines are outlined above.   Tests Ordered: No orders of the defined types were placed in this encounter.   Medication Changes: Meds ordered this encounter  Medications  . amoxicillin-clavulanate (AUGMENTIN) 875-125 MG tablet    Sig: Take 1 tablet by mouth 2 (two) times daily.    Dispense:  20 tablet    Refill:  0    Order Specific Question:   Supervising Provider    AnswerRochel Brome [id number]  . guaiFENesin-codeine 100-10 MG/5ML syrup    Sig: Take 10 mLs by mouth every 6 (six) hours as needed for cough.    Dispense:  120 mL    Refill:  0    Order Specific Question:   Supervising Provider    AnswerShelton Silvas    Follow Up:  In Person prn  Signed, Webb Silversmith, PA-C  06/23/2020 2:09 PM    [place]

## 2020-07-01 ENCOUNTER — Other Ambulatory Visit: Payer: Self-pay | Admitting: Nurse Practitioner

## 2020-07-01 DIAGNOSIS — R0600 Dyspnea, unspecified: Secondary | ICD-10-CM

## 2020-07-13 ENCOUNTER — Encounter: Payer: Self-pay | Admitting: Physician Assistant

## 2020-07-13 ENCOUNTER — Telehealth: Payer: Self-pay

## 2020-07-13 NOTE — Telephone Encounter (Signed)
Letter up front to pick up

## 2020-07-13 NOTE — Telephone Encounter (Signed)
Pt need a not sts she is clear of Covid and is able to travel. Pt is unable to get tested being she is just getting over Covid and her test could still come out pos.

## 2020-08-04 ENCOUNTER — Encounter: Payer: Self-pay | Admitting: Physician Assistant

## 2020-08-04 ENCOUNTER — Ambulatory Visit (INDEPENDENT_AMBULATORY_CARE_PROVIDER_SITE_OTHER): Payer: 59 | Admitting: Physician Assistant

## 2020-08-04 ENCOUNTER — Other Ambulatory Visit: Payer: Self-pay

## 2020-08-04 VITALS — BP 128/82 | HR 84 | Temp 97.3°F | Ht 67.0 in | Wt 272.0 lb

## 2020-08-04 DIAGNOSIS — H9190 Unspecified hearing loss, unspecified ear: Secondary | ICD-10-CM | POA: Insufficient documentation

## 2020-08-04 DIAGNOSIS — M51369 Other intervertebral disc degeneration, lumbar region without mention of lumbar back pain or lower extremity pain: Secondary | ICD-10-CM

## 2020-08-04 DIAGNOSIS — F419 Anxiety disorder, unspecified: Secondary | ICD-10-CM | POA: Diagnosis not present

## 2020-08-04 DIAGNOSIS — Z1231 Encounter for screening mammogram for malignant neoplasm of breast: Secondary | ICD-10-CM

## 2020-08-04 DIAGNOSIS — M5136 Other intervertebral disc degeneration, lumbar region: Secondary | ICD-10-CM

## 2020-08-04 DIAGNOSIS — Z23 Encounter for immunization: Secondary | ICD-10-CM

## 2020-08-04 DIAGNOSIS — E559 Vitamin D deficiency, unspecified: Secondary | ICD-10-CM

## 2020-08-04 DIAGNOSIS — Z13820 Encounter for screening for osteoporosis: Secondary | ICD-10-CM

## 2020-08-04 DIAGNOSIS — E782 Mixed hyperlipidemia: Secondary | ICD-10-CM

## 2020-08-04 DIAGNOSIS — H918X9 Other specified hearing loss, unspecified ear: Secondary | ICD-10-CM

## 2020-08-04 DIAGNOSIS — R600 Localized edema: Secondary | ICD-10-CM

## 2020-08-04 NOTE — Progress Notes (Signed)
Established Patient Office Visit  Subjective:  Patient ID: Linda Acevedo, female    DOB: 16-Jun-1955  Age: 65 y.o. MRN: [id number]  CC:  Chief Complaint  Patient presents with  . Hyperlipidemia    HPI Linda Acevedo presents for follow up hyperlipidemia and chronic back pain  Pt currently on pravachol 20mg  qd for hyperlipidemia - is tolerating well and voices no problems or concerns  She is currently following with [facility] for chronic low back pain- she is currently taking percocet, flexeril and nabumetone- she also has lidocaine patch She states she has pain in her lower back with occasional pain that radiates down both legs but right now pain is manageable  Pt with history of vit D def - is taking weekly supplement - due for labwork  Pt with history of anxiety and depression -is currently on cymbalta 60mg  qd Tolerating medication well  Pt has noted that she has been having trouble with her hearing - would like ENT referral   Past Medical History:  Diagnosis Date  . Anxiety   . Arthritis   . Degenerative disc disease   . Depression   . GERD (gastroesophageal reflux disease)   . Headache 10/08/2018  . Hypertension   . Morbid obesity ([place])   . Sleep apnea    uses a c-pap    Past Surgical History:  Procedure Laterality Date  . ABLATION SAPHENOUS VEIN W/ RFA     Bil  . COLONOSCOPY    . JOINT REPLACEMENT     knee/right    Family History  Problem Relation Age of Onset  . Diabetes Mother   . Cancer Father     Social History   Socioeconomic History  . Marital status: Legally Separated    Spouse name: Not on file  . Number of children: 2  . Years of education: Not on file  . Highest education level: Not on file  Occupational History  . Occupation: homemaker  Tobacco Use  . Smoking status: Never Smoker  . Smokeless tobacco: Never Used  Vaping Use  . Vaping Use: Never used  Substance and Sexual Activity  . Alcohol use: Never  .  Drug use: No  . Sexual activity: Never    Partners: Male  Other Topics Concern  . Not on file  Social History Narrative   Lives with daughter Linda Acevedo   Right handed    2 cups of coffee daily    Social Determinants of Health   Financial Resource Strain: Not on file  Food Insecurity: Not on file  Transportation Needs: Not on file  Physical Activity: Not on file  Stress: Not on file  Social Connections: Not on file  Intimate Partner Violence: Not on file     Current Outpatient Medications:  .  albuterol (VENTOLIN HFA) 108 (90 Base) MCG/ACT inhaler, TAKE 2 PUFFS BY MOUTH EVERY 6 HOURS AS NEEDED FOR WHEEZE OR SHORTNESS OF BREATH, Disp: 8.5 each, Rfl: 1 .  clotrimazole-betamethasone (LOTRISONE) cream, APPLY SMALL AMOUNT TO AFFECTED AREA 2 TIMES A DAY AS NEEDED, Disp: 45 g, Rfl: 2 .  cyclobenzaprine (FLEXERIL) 10 MG tablet, Take by mouth., Disp: , Rfl:  .  DULoxetine (CYMBALTA) 60 MG capsule, TAKE 1 CAPSULE BY MOUTH EVERY DAY, Disp: 90 capsule, Rfl: 2 .  ergocalciferol (VITAMIN D2) 1.25 MG (50000 UT) capsule, Take 1 capsule (50,000 Units total) by mouth once a week., Disp: 12 capsule, Rfl: 1 .  fluticasone (FLONASE) 50 MCG/ACT nasal spray,  SPRAY 2 SPRAYS INTO EACH NOSTRIL EVERY DAY, Disp: 16 mL, Rfl: 5 .  furosemide (LASIX) 20 MG tablet, TAKE 1 TABLET BY MOUTH EVERY DAY AS DIRECTED, Disp: 30 tablet, Rfl: 2 .  lidocaine (LIDODERM) 5 %, SMARTSIG:Patch(s) Topical, Disp: , Rfl:  .  meloxicam (MOBIC) 15 MG tablet, TAKE 1 TABLET (15 MG TOTAL) AT BEDTIME, Disp: 90 tablet, Rfl: 1 .  omeprazole (PRILOSEC) 20 MG capsule, Take by mouth., Disp: , Rfl:  .  oxyCODONE-acetaminophen (PERCOCET) 7.5-325 MG tablet, Take by mouth., Disp: , Rfl:  .  pravastatin (PRAVACHOL) 40 MG tablet, Take 1 tablet (40 mg total) by mouth daily., Disp: 90 tablet, Rfl: 1   Allergies  Allergen Reactions  . Gabapentin Swelling and Anaphylaxis    Face swelling    ROS CONSTITUTIONAL: Negative for chills, fatigue, fever,  unintentional weight gain and unintentional weight loss.  E/N/T: see HPI CARDIOVASCULAR: Negative for chest pain, dizziness, palpitations and pedal edema.  RESPIRATORY: Negative for recent cough and dyspnea.  GASTROINTESTINAL: Negative for abdominal pain, acid reflux symptoms, constipation, diarrhea, nausea and vomiting.  MSK: Negative for arthralgias and myalgias.  INTEGUMENTARY: Negative for rash.  NEUROLOGICAL: Negative for dizziness and headaches.  PSYCHIATRIC: Negative for sleep disturbance and to question depression screen.  Negative for depression, negative for anhedonia.         Objective:    PHYSICAL EXAM:   VS: BP 128/82 (BP Location: Left Arm, Patient Position: Sitting, Cuff Size: Acevedo)   Pulse 84   Temp (!) 97.3 F (36.3 C) (Temporal)   Ht 5\' 7"  (1.702 m)   Wt 272 lb (123.4 kg)   SpO2 95%   BMI 42.60 kg/m   PHYSICAL EXAM:   VS: BP 128/82 (BP Location: Left Arm, Patient Position: Sitting, Cuff Size: Acevedo)   Pulse 84   Temp (!) 97.3 F (36.3 C) (Temporal)   Ht 5\' 7"  (1.702 m)   Wt 272 lb (123.4 kg)   SpO2 95%   BMI 42.60 kg/m   PHYSICAL EXAM:   VS: BP 128/82 (BP Location: Left Arm, Patient Position: Sitting, Cuff Size: Acevedo)   Pulse 84   Temp (!) 97.3 F (36.3 C) (Temporal)   Ht 5\' 7"  (1.702 m)   Wt 272 lb (123.4 kg)   SpO2 95%   BMI 42.60 kg/m   GEN: Well nourished, well developed, in no acute distress  HEENT: normal external ears and nose - normal external auditory canals and TMS - hearing grossly normal - normal nasal mucosa and septum - Lips, Teeth and Gums - normal  Oropharynx - normal mucosa, palate, and posterior pharynx Neck: no JVD or masses - no thyromegaly Cardiac: RRR; no murmurs, rubs, or gallops,no edema - no significant varicosities Respiratory:  normal respiratory rate and pattern with no distress - normal breath sounds with no rales, rhonchi, wheezes or rubs GI: normal bowel sounds, no masses or tenderness MS: no deformity or  atrophy  Skin: warm and dry, no rash  Neuro:  Alert and Oriented x 3, Strength and sensation are intact - CN II-Xii grossly intact Psych: euthymic mood, appropriate affect and demeanor    BP 128/82 (BP Location: Left Arm, Patient Position: Sitting, Cuff Size: Acevedo)   Pulse 84   Temp (!) 97.3 F (36.3 C) (Temporal)   Ht 5\' 7"  (1.702 m)   Wt 272 lb (123.4 kg)   SpO2 95%   BMI 42.60 kg/m  Wt Readings from Last 3 Encounters:  08/04/20 272 lb (123.4 kg)  06/23/20 267 lb (121.1 kg)  06/14/20 265 lb (120.2 kg)     Health Maintenance Due  Topic Date Due  . PAP SMEAR-Modifier  Never done  . MAMMOGRAM  06/14/2019  . DEXA SCAN  Never done  . PNA vac Low Risk Adult (1 of 2 - PCV13) 05/27/2020  . COVID-19 Vaccine (3 - Booster for Pfizer series) 06/30/2020    There are no preventive care reminders to display for this patient.  Lab Results  Component Value Date   TSH 2.110 02/03/2020   Lab Results  Component Value Date   WBC 5.5 02/03/2020   HGB 15.1 02/03/2020   HCT 45.6 02/03/2020   MCV 94 02/03/2020   PLT 262 02/03/2020   Lab Results  Component Value Date   NA 136 02/03/2020   K 4.6 02/03/2020   CO2 27 02/03/2020   GLUCOSE 96 02/03/2020   BUN 10 02/03/2020   CREATININE 0.79 02/03/2020   BILITOT 0.4 02/03/2020   ALKPHOS 116 02/03/2020   AST 20 02/03/2020   ALT 16 02/03/2020   PROT 6.8 02/03/2020   ALBUMIN 4.2 02/03/2020   CALCIUM 9.4 02/03/2020   Lab Results  Component Value Date   CHOL 220 (H) 02/03/2020   Lab Results  Component Value Date   HDL 55 02/03/2020   Lab Results  Component Value Date   LDLCALC 140 (H) 02/03/2020   Lab Results  Component Value Date   TRIG 138 02/03/2020   Lab Results  Component Value Date   CHOLHDL 4.0 02/03/2020   No results found for: HGBA1C    Assessment & Plan:   Problem List Items Addressed This Visit      Nervous and Auditory   Hearing loss   Relevant Orders   Ambulatory referral to ENT      Musculoskeletal and Integument   Lumbar degenerative disc disease     Other   Edema   Relevant Orders   CBC with Differential/Platelet   Comprehensive metabolic panel   TSH   Anxiety   Relevant Orders   TSH   Vitamin D deficiency   Relevant Orders   VITAMIN D 25 Hydroxy (Vit-D Deficiency, Fractures)   Mixed hyperlipidemia - Primary   Relevant Orders   Lipid panel   Need for prophylactic vaccination against Streptococcus pneumoniae (pneumococcus)   Relevant Orders   Pneumococcal conjugate vaccine 13-valent    Other Visit Diagnoses    Encounter for screening mammogram for breast cancer       Relevant Orders   MM DIGITAL SCREENING BILATERAL   Osteoporosis screening       Relevant Orders   DG Bone Density      No orders of the defined types were placed in this encounter.   Follow-up: Return in about 6 months (around 02/04/2021) for chronic fasting.    SARA R Romano Stigger, PA-C

## 2020-08-05 LAB — LIPID PANEL
Chol/HDL Ratio: 3.1 ratio (ref 0.0–4.4)
Cholesterol, Total: 195 mg/dL (ref 100–199)
HDL: 62 mg/dL (ref >39)
LDL Chol Calc (NIH): 120 mg/dL — ABNORMAL HIGH (ref 0–99)
Triglycerides: 73 mg/dL (ref 0–149)
VLDL Cholesterol Cal: 13 mg/dL (ref 5–40)

## 2020-08-05 LAB — COMPREHENSIVE METABOLIC PANEL
ALT: 17 IU/L (ref 0–32)
AST: 17 IU/L (ref 0–40)
Albumin/Globulin Ratio: 1.4 (ref 1.2–2.2)
Albumin: 3.9 g/dL (ref 3.8–4.8)
Alkaline Phosphatase: 113 IU/L (ref 44–121)
BUN/Creatinine Ratio: 17 (ref 12–28)
BUN: 12 mg/dL (ref 8–27)
Bilirubin Total: 0.5 mg/dL (ref 0.0–1.2)
CO2: 23 mmol/L (ref 20–29)
Calcium: 9.3 mg/dL (ref 8.7–10.3)
Chloride: 105 mmol/L (ref 96–106)
Creatinine, Ser: 0.71 mg/dL (ref 0.57–1.00)
Globulin, Total: 2.7 g/dL (ref 1.5–4.5)
Glucose: 94 mg/dL (ref 65–99)
Potassium: 4.6 mmol/L (ref 3.5–5.2)
Sodium: 142 mmol/L (ref 134–144)
Total Protein: 6.6 g/dL (ref 6.0–8.5)
eGFR: 94 mL/min/{1.73_m2} (ref >59)

## 2020-08-05 LAB — CBC WITH DIFFERENTIAL/PLATELET
Basophils Absolute: 0 10*3/uL (ref 0.0–0.2)
Basos: 0 %
EOS (ABSOLUTE): 0.1 10*3/uL (ref 0.0–0.4)
Eos: 2 %
Hematocrit: 42.8 % (ref 34.0–46.6)
Hemoglobin: 14.3 g/dL (ref 11.1–15.9)
Immature Grans (Abs): 0 10*3/uL (ref 0.0–0.1)
Immature Granulocytes: 0 %
Lymphocytes Absolute: 1.8 10*3/uL (ref 0.7–3.1)
Lymphs: 41 %
MCH: 31 pg (ref 26.6–33.0)
MCHC: 33.4 g/dL (ref 31.5–35.7)
MCV: 93 fL (ref 79–97)
Monocytes Absolute: 0.3 10*3/uL (ref 0.1–0.9)
Monocytes: 7 %
Neutrophils Absolute: 2.3 10*3/uL (ref 1.4–7.0)
Neutrophils: 50 %
Platelets: 231 10*3/uL (ref 150–450)
RBC: 4.62 x10E6/uL (ref 3.77–5.28)
RDW: 13.2 % (ref 11.7–15.4)
WBC: 4.5 10*3/uL (ref 3.4–10.8)

## 2020-08-05 LAB — CARDIOVASCULAR RISK ASSESSMENT

## 2020-08-05 LAB — VITAMIN D 25 HYDROXY (VIT D DEFICIENCY, FRACTURES): Vit D, 25-Hydroxy: 39.7 ng/mL (ref 30.0–100.0)

## 2020-08-05 LAB — TSH: TSH: 2.58 u[IU]/mL (ref 0.450–4.500)

## 2020-08-10 NOTE — Progress Notes (Addendum)
PATIENT: Linda Acevedo DOB: 09-11-1955  REASON FOR VISIT: follow up HISTORY FROM: patient  HISTORY OF PRESENT ILLNESS: Today 08/11/20  Linda Acevedo a 65 year old female with history of anxiety, depression, headaches, and OSA on CPAP.  Since CPAP use, no longer reports headaches.  She has remained off Depakote, previously couldn't tolerate Topamax.  30-day CPAP download, shows in general poor compliance, usage 15/30 days (50%), > 4 hours 13 days (43%), AHI 0.7, leak 95 th percentile 26.3. Has chronic low back issues, sees pain management, has had injections.  Indicates her back issues, prevent her from consistent CPAP use, claims she likes to sleep prone, cannot use the CPAP, has full face mask, head gear from the top and bottom (makes no difference).  Takes Flexeril at night for back pain, has percocet.  Is Spanish-speaking, is accompanied by her daughter today. She lives with her other daughter, Linda Acevedo.  She feels better when she uses CPAP, feels less fatigued, thoughts are clearer, less headache. ESS 14.     Update 08/11/2019 SS: Linda Acevedo is a 65 year old Hispanic female with history of anxiety, depression, and headaches. She is on CPAP AutoPap for moderate sleep apnea.  She no longer reports headaches.  She has discontinued Depakote for at least 4 months.  Her CPAP download indicates usage days 28/30 (93%), greater than 4 hours usage, 15 days (50%), average usage 3 hours 44 minutes.  Minimum pressure 6, max pressure 12.  AHI 0.4, leak 95th percentile 33.1.  Recently, she has not been using the CPAP greater than 4 hours due to back pain, having to adjust her position for comfort that is not conducive for wearing CPAP mask.  Fortunately, she is set to receive an epidural steroid injection for back pain this week.  She is hopeful this will allow her to be more compliant with CPAP.  She is Spanish-speaking, presents today for follow-up accompanied by her daughter.  ESS was 7.     HISTORY 01/15/2019 Dr. Jannifer Acevedo: Linda Acevedo is a 65 year old right-handed Hispanic female with a history of anxiety and depression in the past.  The patient had begun to have headaches in the early morning hours, waking up from sleep associated with nightmares.  The patient was sent for sleep evaluation was found to have a moderate level of obstructive sleep apnea, she is now on CPAP.  She could not tolerate Topamax as it made her anxious and nervous, she was switched to Depakote currently taking 500 mg in the evening.  This has had some benefit with the headaches, she only has about 1 headache a week and the headaches are less severe.  She still has occasional nightmares.  She is still having issues with anxiety and depression.  She returns this office for an evaluation.  She comes in with her daughter who is acting as an Astronomer.  REVIEW OF SYSTEMS: Out of a complete 14 system review of symptoms, the patient complains only of the following symptoms, and all other reviewed systems are negative.  Sleep apnea  ALLERGIES: Allergies  Allergen Reactions  . Gabapentin Swelling and Anaphylaxis    Face swelling    HOME MEDICATIONS: Outpatient Medications Prior to Visit  Medication Sig Dispense Refill  . albuterol (VENTOLIN HFA) 108 (90 Base) MCG/ACT inhaler TAKE 2 PUFFS BY MOUTH EVERY 6 HOURS AS NEEDED FOR WHEEZE OR SHORTNESS OF BREATH 8.5 each 1  . clotrimazole-betamethasone (LOTRISONE) cream APPLY SMALL AMOUNT TO AFFECTED AREA 2 TIMES A DAY AS NEEDED 45  g 2  . cyclobenzaprine (FLEXERIL) 10 MG tablet Take by mouth.    . DULoxetine (CYMBALTA) 60 MG capsule TAKE 1 CAPSULE BY MOUTH EVERY DAY 90 capsule 2  . ergocalciferol (VITAMIN D2) 1.25 MG (50000 UT) capsule Take 1 capsule (50,000 Units total) by mouth once a week. 12 capsule 1  . fluticasone (FLONASE) 50 MCG/ACT nasal spray SPRAY 2 SPRAYS INTO EACH NOSTRIL EVERY DAY 16 mL 5  . furosemide (LASIX) 20 MG tablet TAKE 1 TABLET BY MOUTH EVERY DAY  AS DIRECTED 30 tablet 2  . lidocaine (LIDODERM) 5 % SMARTSIG:Patch(s) Topical    . meloxicam (MOBIC) 15 MG tablet TAKE 1 TABLET (15 MG TOTAL) AT BEDTIME 90 tablet 1  . omeprazole (PRILOSEC) 20 MG capsule Take by mouth.    . oxyCODONE-acetaminophen (PERCOCET) 7.5-325 MG tablet Take by mouth.    . pravastatin (PRAVACHOL) 40 MG tablet Take 1 tablet (40 mg total) by mouth daily. 90 tablet 1   No facility-administered medications prior to visit.    PAST MEDICAL HISTORY: Past Medical History:  Diagnosis Date  . Anxiety   . Arthritis   . Degenerative disc disease   . Depression   . GERD (gastroesophageal reflux disease)   . Headache 10/08/2018  . Hypertension   . Morbid obesity ([place])   . Sleep apnea    uses a c-pap    PAST SURGICAL HISTORY: Past Surgical History:  Procedure Laterality Date  . ABLATION SAPHENOUS VEIN W/ RFA     Bil  . COLONOSCOPY    . JOINT REPLACEMENT     knee/right    FAMILY HISTORY: Family History  Problem Relation Age of Onset  . Diabetes Mother   . Cancer Father     SOCIAL HISTORY: Social History   Socioeconomic History  . Marital status: Legally Separated    Spouse name: Not on file  . Number of children: 2  . Years of education: Not on file  . Highest education level: Not on file  Occupational History  . Occupation: homemaker  Tobacco Use  . Smoking status: Never Smoker  . Smokeless tobacco: Never Used  Vaping Use  . Vaping Use: Never used  Substance and Sexual Activity  . Alcohol use: Never  . Drug use: No  . Sexual activity: Never    Partners: Male  Other Topics Concern  . Not on file  Social History Narrative   Lives with daughter Linda Acevedo   Right handed    2 cups of coffee daily    Social Determinants of Health   Financial Resource Strain: Not on file  Food Insecurity: Not on file  Transportation Needs: Not on file  Physical Activity: Not on file  Stress: Not on file  Social Connections: Not on file  Intimate Partner  Violence: Not on file   PHYSICAL EXAM  Vitals:   08/11/20 0750  BP: 125/79  Pulse: 77  Weight: 271 lb (122.9 kg)  Height: 5\' 7"  (1.702 m)   Body mass index is 42.44 kg/m.  Generalized: Well developed, in no acute distress   Neurological examination  Mentation: Alert oriented to time, place, is Spanish-speaking, history is provided by her daughter. Follows all commands. Cranial nerve II-XII: Pupils were equal round reactive to light. Extraocular movements were full, visual field were full on confrontational test. Facial sensation and strength were normal. Head turning and shoulder shrug were normal and symmetric. Motor: Good strength of all extremities Sensory: Sensory testing is intact to soft touch on all  4 extremities. No evidence of extinction is noted.  Coordination: Cerebellar testing reveals good finger-nose-finger and heel-to-shin bilaterally.  Gait and station: Slow to rise from seated position with pushoff, gait is antalgic on the left Reflexes: Deep tendon reflexes are symmetric   DIAGNOSTIC DATA (LABS, IMAGING, TESTING) - I reviewed patient records, labs, notes, testing and imaging myself where available.  Lab Results  Component Value Date   WBC 4.5 08/04/2020   HGB 14.3 08/04/2020   HCT 42.8 08/04/2020   MCV 93 08/04/2020   PLT 231 08/04/2020      Component Value Date/Time   NA 142 08/04/2020 0931   K 4.6 08/04/2020 0931   CL 105 08/04/2020 0931   CO2 23 08/04/2020 0931   GLUCOSE 94 08/04/2020 0931   GLUCOSE 107 (H) 11/23/2008 0445   BUN 12 08/04/2020 0931   CREATININE 0.71 08/04/2020 0931   CALCIUM 9.3 08/04/2020 0931   PROT 6.6 08/04/2020 0931   ALBUMIN 3.9 08/04/2020 0931   AST 17 08/04/2020 0931   ALT 17 08/04/2020 0931   ALKPHOS 113 08/04/2020 0931   BILITOT 0.5 08/04/2020 0931   GFRNONAA 79 02/03/2020 1135   GFRAA 91 02/03/2020 1135   Lab Results  Component Value Date   CHOL 195 08/04/2020   HDL 62 08/04/2020   LDLCALC 120 (H) 08/04/2020    TRIG 73 08/04/2020   CHOLHDL 3.1 08/04/2020   No results found for: HGBA1C No results found for: VITAMINB12 Lab Results  Component Value Date   TSH 2.580 08/04/2020   ASSESSMENT AND PLAN 65 y.o. year old female  has a past medical history of Anxiety, Arthritis, Degenerative disc disease, Depression, GERD (gastroesophageal reflux disease), Headache (10/08/2018), Hypertension, Morbid obesity ([place]), and Sleep apnea. here with:  1.  History of headache, seen by Dr. Jannifer Acevedo previously, no longer on Depakote 2.  Sleep apnea on CPAP, managed by Dr. Rexene Alberts  -No longer complains of significant headache since CPAP use, has come off Depakote, could not tolerate Topamax -CPAP compliance is overall poor, claims related to chronic back pain, difficulty finding a comfortable position that accommodates the CPAP mask and equipment -Will order mask refit, DME reeducation, to try and find more conducive mask to allow for more comfortable sleep position, hopefully improving compliance -Discussed ideally using nightly, greater than 4 hours -Follow-up in 4 months, to ensure good compliance, okay for 15 VV or telephone visit to review, daughter will be present, the patient speaks Spanish  I spent 30 minutes of face-to-face and non-face-to-face time with patient. This included previsit chart review, lab review, study review, order entry, electronic health record documentation, patient education.  Butler Denmark, AGNP-C, DNP 08/11/2020, 8:37 AM [facility] [address] [place], [place] [address] [phone]  I reviewed the above note and documentation by the Nurse Practitioner and agree with the history, exam, assessment and plan as outlined above. I was available for consultation. Star Age, MD, PhD [facility])

## 2020-08-11 ENCOUNTER — Encounter: Payer: Self-pay | Admitting: Neurology

## 2020-08-11 ENCOUNTER — Other Ambulatory Visit: Payer: Self-pay

## 2020-08-11 ENCOUNTER — Ambulatory Visit (INDEPENDENT_AMBULATORY_CARE_PROVIDER_SITE_OTHER): Payer: 59 | Admitting: Neurology

## 2020-08-11 VITALS — BP 125/79 | HR 77 | Ht 67.0 in | Wt 271.0 lb

## 2020-08-11 DIAGNOSIS — G4733 Obstructive sleep apnea (adult) (pediatric): Secondary | ICD-10-CM

## 2020-08-11 DIAGNOSIS — Z9989 Dependence on other enabling machines and devices: Secondary | ICD-10-CM

## 2020-08-11 NOTE — Patient Instructions (Signed)
Order mask refit, re-education from DME to help with compliance  Return in 4 months to ensure compliance

## 2020-08-15 ENCOUNTER — Other Ambulatory Visit: Payer: Self-pay | Admitting: Nurse Practitioner

## 2020-08-15 DIAGNOSIS — R0600 Dyspnea, unspecified: Secondary | ICD-10-CM

## 2020-08-18 NOTE — Progress Notes (Signed)
Cm sent to [facility]

## 2020-08-23 ENCOUNTER — Telehealth: Payer: Self-pay

## 2020-08-23 NOTE — Telephone Encounter (Signed)
Linda Acevedo was notified of apt information.

## 2020-08-23 NOTE — Telephone Encounter (Signed)
I left a message asking the patient to call our office back regarding an appointment. NO information regarding appointment was left on patients phone.  A copy of the order was mailed to the patient with appointment information.  Mammogram and DEXA was ordered:  PLEASE arrive at 1:10 for registration Your appointment date is 09/20/2020 Your appointment time is 1:40 and a 2:00  Order faxed on 08/23/2020

## 2020-08-28 ENCOUNTER — Other Ambulatory Visit: Payer: Self-pay | Admitting: Family Medicine

## 2020-08-28 ENCOUNTER — Other Ambulatory Visit: Payer: Self-pay | Admitting: Physician Assistant

## 2020-09-01 ENCOUNTER — Ambulatory Visit (INDEPENDENT_AMBULATORY_CARE_PROVIDER_SITE_OTHER): Payer: 59 | Admitting: Otolaryngology

## 2020-09-08 ENCOUNTER — Other Ambulatory Visit: Payer: Self-pay

## 2020-09-08 ENCOUNTER — Ambulatory Visit (INDEPENDENT_AMBULATORY_CARE_PROVIDER_SITE_OTHER): Payer: 59 | Admitting: Otolaryngology

## 2020-09-08 VITALS — Temp 96.6°F

## 2020-09-08 DIAGNOSIS — H903 Sensorineural hearing loss, bilateral: Secondary | ICD-10-CM | POA: Diagnosis not present

## 2020-09-08 NOTE — Progress Notes (Signed)
HPI: Linda Acevedo is a 65 y.o. female who presents is referred by her PCP for evaluation of hearing loss.  She presents today with her daughter who does the translation.  She has noted that her hearing is gotten worse especially on the left side..  Past Medical History:  Diagnosis Date  . Anxiety   . Arthritis   . Degenerative disc disease   . Depression   . GERD (gastroesophageal reflux disease)   . Headache 10/08/2018  . Hypertension   . Morbid obesity ([place])   . Sleep apnea    uses a c-pap   Past Surgical History:  Procedure Laterality Date  . ABLATION SAPHENOUS VEIN W/ RFA     Bil  . COLONOSCOPY    . JOINT REPLACEMENT     knee/right   Social History   Socioeconomic History  . Marital status: Legally Separated    Spouse name: Not on file  . Number of children: 2  . Years of education: Not on file  . Highest education level: Not on file  Occupational History  . Occupation: homemaker  Tobacco Use  . Smoking status: Never Smoker  . Smokeless tobacco: Never Used  Vaping Use  . Vaping Use: Never used  Substance and Sexual Activity  . Alcohol use: Never  . Drug use: No  . Sexual activity: Never    Partners: Male  Other Topics Concern  . Not on file  Social History Narrative   Lives with daughter Linda Acevedo   Right handed    2 cups of coffee daily    Social Determinants of Health   Financial Resource Strain: Not on file  Food Insecurity: Not on file  Transportation Needs: Not on file  Physical Activity: Not on file  Stress: Not on file  Social Connections: Not on file   Family History  Problem Relation Age of Onset  . Diabetes Mother   . Cancer Father    Allergies  Allergen Reactions  . Gabapentin Swelling and Anaphylaxis    Face swelling   Prior to Admission medications   Medication Sig Start Date End Date Taking? Authorizing Provider  albuterol (VENTOLIN HFA) 108 (90 Base) MCG/ACT inhaler TAKE 2 PUFFS BY MOUTH EVERY 6 HOURS AS NEEDED FOR WHEEZE  OR SHORTNESS OF BREATH 08/16/20   Marge Duncans, PA-C  clotrimazole-betamethasone (LOTRISONE) cream APPLY SMALL AMOUNT TO AFFECTED AREA 2 TIMES A DAY AS NEEDED 06/14/20   Marge Duncans, PA-C  cyclobenzaprine (FLEXERIL) 10 MG tablet Take by mouth. 11/21/19   [provider]  DULoxetine (CYMBALTA) 60 MG capsule TAKE 1 CAPSULE BY MOUTH EVERY DAY 08/30/20   Marge Duncans, PA-C  fluticasone [facility]) 50 MCG/ACT nasal spray SPRAY 2 SPRAYS INTO EACH NOSTRIL EVERY DAY 08/30/20   Marge Duncans, PA-C  furosemide (LASIX) 20 MG tablet TAKE 1 TABLET BY MOUTH EVERY DAY AS DIRECTED 05/03/20   Marge Duncans, PA-C  lidocaine (LIDODERM) 5 % SMARTSIG:Patch(s) Topical 07/23/20   [provider]  meloxicam (MOBIC) 15 MG tablet TAKE 1 TABLET (15 MG TOTAL) AT BEDTIME 08/29/20   Cox, Kirsten, MD  omeprazole (PRILOSEC) 20 MG capsule Take by mouth.    [provider]  oxyCODONE-acetaminophen (PERCOCET) 7.5-325 MG tablet Take by mouth. 11/20/19   [provider]  pravastatin (PRAVACHOL) 40 MG tablet TAKE 1 TABLET BY MOUTH EVERY DAY 08/30/20   Marge Duncans, PA-C  Vitamin D, Ergocalciferol, (DRISDOL) 1.25 MG (50000 UNIT) CAPS capsule TAKE 1 CAPSULE BY MOUTH ONE TIME PER WEEK 08/30/20  Marge Duncans, PA-C     Positive ROS: Otherwise negative  All other systems have been reviewed and were otherwise negative with the exception of those mentioned in the HPI and as above.  Physical Exam: Constitutional: Alert, well-appearing, no acute distress Ears: External ears without lesions or tenderness. Ear canals are clear bilaterally.  Both TMs are clear. Nasal: External nose without lesions. Septum with minimal deformity. Clear nasal passages bilaterally with no signs of infection. Oral: Lips and gums without lesions. Tongue and palate mucosa without lesions. Posterior oropharynx clear. Neck: No palpable adenopathy or masses Respiratory: Breathing comfortably  Skin: No facial/neck lesions or rash noted.  Audiologic  testing in the office today demonstrated a mild to moderate downsloping sensorineural hearing loss in both ears which were symmetric.  She had type A tympanograms bilaterally.  I would estimate her SRT's to be approximately 40 dB.  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  Plan: She would be a candidate for hearing aids and discussed this with her and her daughter.  She will follow-up with our audiologist concerning obtaining hearing aids.   Radene Journey, MD   CC:

## 2020-09-20 ENCOUNTER — Other Ambulatory Visit: Payer: Self-pay | Admitting: Physician Assistant

## 2020-09-20 DIAGNOSIS — R928 Other abnormal and inconclusive findings on diagnostic imaging of breast: Secondary | ICD-10-CM

## 2020-09-21 ENCOUNTER — Encounter (INDEPENDENT_AMBULATORY_CARE_PROVIDER_SITE_OTHER): Payer: Self-pay

## 2020-09-22 ENCOUNTER — Telehealth: Payer: Self-pay | Admitting: Physician Assistant

## 2020-09-22 NOTE — Telephone Encounter (Signed)
   Linda Acevedo has been scheduled for the following appointment:  WHAT: Diagnostic Mammogram & Ultrasound WHERE: [facility] DATE: 10/14/20 TIME: 2:30  Patient has been made aware. Spoke to patient's daughter.

## 2020-10-19 ENCOUNTER — Telehealth: Payer: Self-pay

## 2020-10-19 ENCOUNTER — Other Ambulatory Visit: Payer: Self-pay | Admitting: Physician Assistant

## 2020-10-19 DIAGNOSIS — N63 Unspecified lump in unspecified breast: Secondary | ICD-10-CM

## 2020-10-19 NOTE — Telephone Encounter (Signed)
I would see Dr Rosendo Gros in [place]  - will send for consult And this is just as a precaution -- he may also advise to wait and repeat mammo/ultrasound in 6 months but with a new mass there would like to get surgery consult as well

## 2020-10-19 NOTE — Telephone Encounter (Signed)
Patient daughter called [facility]) stated that they would like to go ahead and talk with surgeon, and who ever your recommend , she will go to.

## 2020-11-27 ENCOUNTER — Other Ambulatory Visit: Payer: Self-pay | Admitting: Physician Assistant

## 2020-11-27 DIAGNOSIS — R0600 Dyspnea, unspecified: Secondary | ICD-10-CM

## 2020-12-03 ENCOUNTER — Other Ambulatory Visit: Payer: Self-pay | Admitting: Surgery

## 2020-12-13 NOTE — Patient Instructions (Signed)
Please continue using your CPAP regularly. While your insurance requires that you use CPAP at least 4 hours each night on 70% of the nights, I recommend, that you not skip any nights and use it throughout the night if you can. Getting used to CPAP and staying with the treatment long term does take time and patience and discipline. Untreated obstructive sleep apnea when it is moderate to severe can have an adverse impact on cardiovascular health and raise her risk for heart disease, arrhythmias, hypertension, congestive heart failure, stroke and diabetes. Untreated obstructive sleep apnea causes sleep disruption, nonrestorative sleep, and sleep deprivation. This can have an impact on your day to day functioning and cause daytime sleepiness and impairment of cognitive function, memory loss, mood disturbance, and problems focussing. Using CPAP regularly can improve these symptoms.   Follow up with Judson Roch in 1 year

## 2020-12-13 NOTE — Progress Notes (Addendum)
PATIENT: Linda Acevedo DOB: 04/23/1956  REASON FOR VISIT: follow up HISTORY FROM: patient  Virtual Visit via Telephone Note  I connected with Linda Acevedo on 12/14/20 at  7:45 AM EDT by telephone and verified that I am speaking with the correct person using two identifiers.   I discussed the limitations, risks, security and privacy concerns of performing an evaluation and management service by telephone and the availability of in person appointments. I also discussed with the patient that there may be a patient responsible charge related to this service. The patient expressed understanding and agreed to proceed.   History of Present Illness:  12/14/20 ALL: Linda Acevedo is a 65 y.o. female here today for follow up for OSA on CPAP. She is doing better with compliance. She was last seen by Linda Acevedo ordered a mask refitting and reeducation. She is doing better with compliance. Her daughter is her interpreter and assists with virtual televisit, today. She is using CPAP most every night. She continues to work on meeting 4 hour compliance. She denies concerns with mask or supplies. Her daughter knows that she did get a different mask but unsure if she has replaced it as advised. Linda Acevedo is feeling well and without complaints.      08/11/20 SS: [name] a 65 year old female with history of anxiety, depression, headaches, and OSA on CPAP.  Since CPAP use, no longer reports headaches.  She has remained off Depakote, previously couldn't tolerate Topamax.  30-day CPAP download, shows in general poor compliance, usage 15/30 days (50%), > 4 hours 13 days (43%), AHI 0.7, leak 95 th percentile 26.3. Has chronic low back issues, sees pain management, has had injections.  Indicates her back issues, prevent her from consistent CPAP use, claims she likes to sleep prone, cannot use the CPAP, has full face mask, head gear from the top and bottom (makes no difference).  Takes Flexeril at night  for back pain, has percocet.  Is Spanish-speaking, is accompanied by her daughter today. She lives with her other daughter, Linda Acevedo.  She feels better when she uses CPAP, feels less fatigued, thoughts are clearer, less headache. ESS 14.          Observations/Objective:  Generalized: Well developed, in no acute distress  Mentation: Alert oriented to time, place, history taking. Follows all commands speech and language fluent   Assessment and Plan:  65 y.o. year old female  has a past medical history of Anxiety, Arthritis, Degenerative disc disease, Depression, GERD (gastroesophageal reflux disease), Headache (10/08/2018), Hypertension, Morbid obesity ([place]), and Sleep apnea. here with    ICD-10-CM   1. OSA on CPAP  G47.33    Z99.89       Linda Acevedo is doing well, today. She has worked to improve daily and 4 hour compliance. Compliance is now 87% with daily usage and 70% with 4 hour usage. She was encouraged to continue CPAP nightly and for at least 4 hours. We have discussed concerns for air leak, however, AHI is well managed. Her daughter will ensure that mask and supplies are replaced as directed. If concerns for leak continue, she will call me to let me know. She will follow up with Linda Acevedo in 6 months, sooner if needed.   No orders of the defined types were placed in this encounter.   No orders of the defined types were placed in this encounter.    Follow Up Instructions:  I discussed the assessment and treatment plan with the patient. The  patient was provided an opportunity to ask questions and all were answered. The patient agreed with the plan and demonstrated an understanding of the instructions.   The patient was advised to call back or seek an in-person evaluation if the symptoms worsen or if the condition fails to improve as anticipated.  I provided 15 minutes of non-face-to-face time during this encounter. Patient located at their place of residence during [place] visit.  Provider is in the office.    Debbora Presto, NP   I reviewed the above note and documentation by the Nurse Practitioner and agree with the history, exam, assessment and plan as outlined above. I was available for consultation. Star Age, MD, PhD [facility])

## 2020-12-14 ENCOUNTER — Other Ambulatory Visit: Payer: Self-pay

## 2020-12-14 ENCOUNTER — Telehealth (INDEPENDENT_AMBULATORY_CARE_PROVIDER_SITE_OTHER): Payer: 59 | Admitting: Family Medicine

## 2020-12-14 ENCOUNTER — Encounter: Payer: Self-pay | Admitting: Family Medicine

## 2020-12-14 DIAGNOSIS — Z9989 Dependence on other enabling machines and devices: Secondary | ICD-10-CM

## 2020-12-14 DIAGNOSIS — G4733 Obstructive sleep apnea (adult) (pediatric): Secondary | ICD-10-CM

## 2020-12-23 ENCOUNTER — Other Ambulatory Visit: Payer: Self-pay | Admitting: Family Medicine

## 2020-12-23 ENCOUNTER — Other Ambulatory Visit: Payer: Self-pay | Admitting: Physician Assistant

## 2020-12-23 MED ORDER — OMEPRAZOLE 20 MG PO CPDR: 20.0000 mg | DELAYED_RELEASE_CAPSULE | Freq: Every day | ORAL | 0 refills | Status: DC

## 2021-01-31 ENCOUNTER — Other Ambulatory Visit: Payer: Self-pay | Admitting: Thoracic Surgery (Cardiothoracic Vascular Surgery)

## 2021-01-31 DIAGNOSIS — I712 Thoracic aortic aneurysm, without rupture, unspecified: Secondary | ICD-10-CM

## 2021-02-07 ENCOUNTER — Encounter: Payer: Self-pay | Admitting: Physician Assistant

## 2021-02-07 ENCOUNTER — Ambulatory Visit (INDEPENDENT_AMBULATORY_CARE_PROVIDER_SITE_OTHER): Payer: 59 | Admitting: Physician Assistant

## 2021-02-07 ENCOUNTER — Other Ambulatory Visit: Payer: Self-pay

## 2021-02-07 VITALS — BP 120/80 | HR 84 | Temp 96.9°F | Ht 67.0 in | Wt 262.2 lb

## 2021-02-07 DIAGNOSIS — B351 Tinea unguium: Secondary | ICD-10-CM | POA: Insufficient documentation

## 2021-02-07 DIAGNOSIS — E559 Vitamin D deficiency, unspecified: Secondary | ICD-10-CM | POA: Diagnosis not present

## 2021-02-07 DIAGNOSIS — Z23 Encounter for immunization: Secondary | ICD-10-CM | POA: Diagnosis not present

## 2021-02-07 DIAGNOSIS — F419 Anxiety disorder, unspecified: Secondary | ICD-10-CM

## 2021-02-07 DIAGNOSIS — R5383 Other fatigue: Secondary | ICD-10-CM | POA: Diagnosis not present

## 2021-02-07 DIAGNOSIS — E782 Mixed hyperlipidemia: Secondary | ICD-10-CM

## 2021-02-07 MED ORDER — TERBINAFINE HCL 250 MG PO TABS: 250.0000 mg | ORAL_TABLET | Freq: Every day | ORAL | 0 refills | Status: DC

## 2021-02-07 NOTE — Progress Notes (Signed)
Established Patient Office Visit  Subjective:  Patient ID: Linda Acevedo, female    DOB: 05/22/1956  Age: 65 y.o. MRN: [id number]  CC:  Chief Complaint  Patient presents with   Hyperlipidemia    HPI Linda Acevedo presents for follow up hyperlipidemia and chronic back pain  Pt currently on pravachol 64m qd for hyperlipidemia - is tolerating well and voices no problems or concerns  She is currently following with [facility] for chronic low back pain- she is currently taking percocet, flexeril and nabumetone- she also has lidocaine patch She states she has pain in her lower back with occasional pain that radiates down both legs but right now pain is manageable  Pt with history of vit D def - is taking weekly supplement - due for labwork  Pt with history of anxiety and depression -is currently on cymbalta [id number] Tolerating medication well  Pt complains of toenail fungus - would like to start medication for that - mostly great toes  Would like flu shot today  Past Medical History:  Diagnosis Date   Anxiety    Arthritis    Degenerative disc disease    Depression    GERD (gastroesophageal reflux disease)    Headache 10/08/2018   Hypertension    Morbid obesity ([place]   Sleep apnea    uses a c-pap    Past Surgical History:  Procedure Laterality Date   ABLATION SAPHENOUS VEIN W/ RFA     Bil   COLONOSCOPY     JOINT REPLACEMENT     knee/right    Family History  Problem Relation Age of Onset   Diabetes Mother    Cancer Father     Social History   Socioeconomic History   Marital status: Legally Separated    Spouse name: Not on file   Number of children: 2   Years of education: Not on file   Highest education level: Not on file  Occupational History   Occupation: homemaker  Tobacco Use   Smoking status: Never   Smokeless tobacco: Never  Vaping Use   Vaping Use: Never used  Substance and Sexual Activity   Alcohol use: Never   Drug  use: No   Sexual activity: Never    Partners: Male  Other Topics Concern   Not on file  Social History Narrative   Lives with daughter YaMacon Large Right handed    2 cups of coffee daily    Social Determinants of Health   Financial Resource Strain: Not on file  Food Insecurity: Not on file  Transportation Needs: Not on file  Physical Activity: Not on file  Stress: Not on file  Social Connections: Not on file  Intimate Partner Violence: Not on file     Current Outpatient Medications:    albuterol (VENTOLIN HFA) 108 (90 Base) MCG/ACT inhaler, TAKE 2 PUFFS BY MOUTH EVERY 6 HOURS AS NEEDED FOR WHEEZE OR SHORTNESS OF BREATH, Disp: 8.5 each, Rfl: 1   clotrimazole-betamethasone (LOTRISONE) cream, APPLY SMALL AMOUNT TO AFFECTED AREA 2 TIMES A DAY AS NEEDED, Disp: 45 g, Rfl: 2   cyclobenzaprine (FLEXERIL) 10 MG tablet, Take by mouth., Disp: , Rfl:    DULoxetine (CYMBALTA) 60 MG capsule, TAKE 1 CAPSULE BY MOUTH EVERY DAY, Disp: 90 capsule, Rfl: 2   fluticasone (FLONASE) 50 MCG/ACT nasal spray, SPRAY 2 SPRAYS INTO EACH NOSTRIL EVERY DAY, Disp: 48 mL, Rfl: 1   furosemide (LASIX) 20 MG tablet, TAKE 1 TABLET  BY MOUTH EVERY DAY AS DIRECTED, Disp: 30 tablet, Rfl: 2   lidocaine (LIDODERM) 5 %, SMARTSIG:Patch(s) Topical, Disp: , Rfl:    meloxicam (MOBIC) 15 MG tablet, TAKE 1 TABLET (15 MG TOTAL) AT BEDTIME, Disp: 90 tablet, Rfl: 1   omeprazole (PRILOSEC) 20 MG capsule, Take 1 capsule (20 mg total) by mouth daily., Disp: 90 capsule, Rfl: 0   oxyCODONE-acetaminophen (PERCOCET) 7.5-325 MG tablet, Take by mouth., Disp: , Rfl:    pravastatin (PRAVACHOL) 40 MG tablet, TAKE 1 TABLET BY MOUTH EVERY DAY, Disp: 90 tablet, Rfl: 1   terbinafine (LAMISIL) 250 MG tablet, Take 1 tablet (250 mg total) by mouth daily., Disp: 90 tablet, Rfl: 0   Vitamin D, Ergocalciferol, (DRISDOL) 1.25 MG (50000 UNIT) CAPS capsule, TAKE 1 CAPSULE BY MOUTH ONE TIME PER WEEK, Disp: 12 capsule, Rfl: 1   Allergies  Allergen Reactions    Gabapentin Swelling and Anaphylaxis    Face swelling    CONSTITUTIONAL: Negative for chills, fatigue, fever, unintentional weight gain and unintentional weight loss.  E/N/T: Negative for ear pain, nasal congestion and sore throat.  CARDIOVASCULAR: Negative for chest pain, dizziness, palpitations and pedal edema.  RESPIRATORY: Negative for recent cough and dyspnea.  GASTROINTESTINAL: Negative for abdominal pain, acid reflux symptoms, constipation, diarrhea, nausea and vomiting.  MSK: Negative for arthralgias and myalgias.  INTEGUMENTARY: see HPI NEUROLOGICAL: Negative for dizziness and headaches.  PSYCHIATRIC: Negative for sleep disturbance and to question depression screen.  Negative for depression, negative for anhedonia.      Objective:    PHYSICAL EXAM:   VS: BP 120/80 (BP Location: Left Arm, Patient Position: Sitting, Cuff Size: Large)   Pulse 84   Temp (!) 96.9 F (36.1 C) (Temporal)   Ht 5' 7"  (1.702 m)   Wt 262 lb 3.2 oz (118.9 kg)   SpO2 96%   BMI 41.07 kg/m   GEN: Well nourished, well developed, in no acute distress  Cardiac: RRR; no murmurs, rubs, or gallops,no edema - does have multiple varicose veins Respiratory:  normal respiratory rate and pattern with no distress - normal breath sounds with no rales, rhonchi, wheezes or rubs MS: no deformity or atrophy  Skin: warm and dry, no rash - thick toenails noted Neuro:  Alert and Oriented x 3, Strength and sensation are intact - CN II-Xii grossly intact Psych: euthymic mood, appropriate affect and demeanor     Health Maintenance Due  Topic Date Due   PAP SMEAR-Modifier  Never done   Zoster Vaccines- Shingrix (1 of 2) Never done    There are no preventive care reminders to display for this patient.  Lab Results  Component Value Date   TSH 2.580 08/04/2020   Lab Results  Component Value Date   WBC 4.5 08/04/2020   HGB 14.3 08/04/2020   HCT 42.8 08/04/2020   MCV 93 08/04/2020   PLT 231 08/04/2020   Lab  Results  Component Value Date   NA 142 08/04/2020   K 4.6 08/04/2020   CO2 23 08/04/2020   GLUCOSE 94 08/04/2020   BUN 12 08/04/2020   CREATININE 0.71 08/04/2020   BILITOT 0.5 08/04/2020   ALKPHOS 113 08/04/2020   AST 17 08/04/2020   ALT 17 08/04/2020   PROT 6.6 08/04/2020   ALBUMIN 3.9 08/04/2020   CALCIUM 9.3 08/04/2020   EGFR 94 08/04/2020   Lab Results  Component Value Date   CHOL 195 08/04/2020   Lab Results  Component Value Date   HDL 62 08/04/2020  Lab Results  Component Value Date   LDLCALC 120 (H) 08/04/2020   Lab Results  Component Value Date   TRIG 73 08/04/2020   Lab Results  Component Value Date   CHOLHDL 3.1 08/04/2020   No results found for: HGBA1C    Assessment & Plan:   Problem List Items Addressed This Visit       Other      Vitamin D deficiency   Relevant Orders   VITAMIN D 25 Hydroxy (Vit-D Deficiency, Fractures)   Mixed hyperlipidemia - Primary   Relevant Orders   Lipid panel Continue meds and watch diet   Other fatigue   Relevant Orders   CBC with Differential/Platelet   Comprehensive metabolic panel   TSH   Need for prophylactic vaccination and inoculation against influenza   Relevant Orders   Flu Vaccine QUAD High Dose(Fluad)   Other Visit Diagnoses     Onychomycosis       Relevant Medications   terbinafine (LAMISIL) 250 MG tablet       Meds ordered this encounter  Medications   terbinafine (LAMISIL) 250 MG tablet    Sig: Take 1 tablet (250 mg total) by mouth daily.    Dispense:  90 tablet    Refill:  0    Order Specific Question:   Supervising Provider    Answer:   Shelton Silvas    Follow-up: Return in about 6 months (around 08/07/2021) for follow up 6 months fasting.    SARA R Tenya Araque, PA-C  Subjective:  Patient ID: Linda Acevedo, female    DOB: [birth year]  Age: 65 y.o. MRN: [id number]  Chief Complaint  Patient presents with   Hypertension    HPI   Current Outpatient Medications on  File Prior to Visit  Medication Sig Dispense Refill   albuterol (VENTOLIN HFA) 108 (90 Base) MCG/ACT inhaler TAKE 2 PUFFS BY MOUTH EVERY 6 HOURS AS NEEDED FOR WHEEZE OR SHORTNESS OF BREATH 8.5 each 1   clotrimazole-betamethasone (LOTRISONE) cream APPLY SMALL AMOUNT TO AFFECTED AREA 2 TIMES A DAY AS NEEDED 45 g 2   cyclobenzaprine (FLEXERIL) 10 MG tablet Take by mouth.     DULoxetine (CYMBALTA) 60 MG capsule TAKE 1 CAPSULE BY MOUTH EVERY DAY 90 capsule 2   fluticasone (FLONASE) 50 MCG/ACT nasal spray SPRAY 2 SPRAYS INTO EACH NOSTRIL EVERY DAY 48 mL 1   furosemide (LASIX) 20 MG tablet TAKE 1 TABLET BY MOUTH EVERY DAY AS DIRECTED 30 tablet 2   lidocaine (LIDODERM) 5 % SMARTSIG:Patch(s) Topical     meloxicam (MOBIC) 15 MG tablet TAKE 1 TABLET (15 MG TOTAL) AT BEDTIME 90 tablet 1   omeprazole (PRILOSEC) 20 MG capsule Take 1 capsule (20 mg total) by mouth daily. 90 capsule 0   oxyCODONE-acetaminophen (PERCOCET) 7.5-325 MG tablet Take by mouth.     pravastatin (PRAVACHOL) 40 MG tablet TAKE 1 TABLET BY MOUTH EVERY DAY 90 tablet 1   Vitamin D, Ergocalciferol, (DRISDOL) 1.25 MG (50000 UNIT) CAPS capsule TAKE 1 CAPSULE BY MOUTH ONE TIME PER WEEK 12 capsule 1   No current facility-administered medications on file prior to visit.   Past Medical History:  Diagnosis Date   Anxiety    Arthritis    Degenerative disc disease    Depression    GERD (gastroesophageal reflux disease)    Headache 10/08/2018   Hypertension    Morbid obesity ([place])    Sleep apnea    uses a c-pap   Past Surgical History:  Procedure Laterality Date   ABLATION SAPHENOUS VEIN W/ RFA     Bil   COLONOSCOPY     JOINT REPLACEMENT     knee/right    Family History  Problem Relation Age of Onset   Diabetes Mother    Cancer Father    Social History   Socioeconomic History   Marital status: Legally Separated    Spouse name: Not on file   Number of children: 2   Years of education: Not on file   Highest education level: Not  on file  Occupational History   Occupation: homemaker  Tobacco Use   Smoking status: Never   Smokeless tobacco: Never  Vaping Use   Vaping Use: Never used  Substance and Sexual Activity   Alcohol use: Never   Drug use: No   Sexual activity: Never    Partners: Male  Other Topics Concern   Not on file  Social History Narrative   Lives with daughter [name]   Right handed    2 cups of coffee daily    Social Determinants of Health   Financial Resource Strain: Not on file  Food Insecurity: Not on file  Transportation Needs: Not on file  Physical Activity: Not on file  Stress: Not on file  Social Connections: Not on file    Review of Systems   Objective:  BP 120/80 (BP Location: Left Arm, Patient Position: Sitting, Cuff Size: Large)   Pulse 84   Temp (!) 96.9 F (36.1 C) (Temporal)   Ht 5' 7"  (1.702 m)   Wt 262 lb 3.2 oz (118.9 kg)   SpO2 96%   BMI 41.07 kg/m   BP/Weight 02/07/2021 08/11/2020 6/43/3295  Systolic BP 188 416 606  Diastolic BP 80 79 82  Wt. (Lbs) 262.2 271 272  BMI 41.07 42.44 42.6    Physical Exam  Diabetic Foot Exam - Simple   No data filed      Lab Results  Component Value Date   WBC 4.5 08/04/2020   HGB 14.3 08/04/2020   HCT 42.8 08/04/2020   PLT 231 08/04/2020   GLUCOSE 94 08/04/2020   CHOL 195 08/04/2020   TRIG 73 08/04/2020   HDL 62 08/04/2020   LDLCALC 120 (H) 08/04/2020   ALT 17 08/04/2020   AST 17 08/04/2020   NA 142 08/04/2020   K 4.6 08/04/2020   CL 105 08/04/2020   CREATININE 0.71 08/04/2020   BUN 12 08/04/2020   CO2 23 08/04/2020   TSH 2.580 08/04/2020      Assessment & Plan:   Problem List Items Addressed This Visit       Musculoskeletal and Integument   Onychomycosis   Relevant Medications   terbinafine (LAMISIL) 250 MG tablet     Other   Anxiety   Vitamin D deficiency   Relevant Orders   VITAMIN D 25 Hydroxy (Vit-D Deficiency, Fractures)   Mixed hyperlipidemia - Primary   Relevant Orders   Lipid  panel   Other fatigue   Relevant Orders   CBC with Differential/Platelet   Comprehensive metabolic panel   TSH   Need for prophylactic vaccination and inoculation against influenza   Relevant Orders   Flu Vaccine QUAD High Dose(Fluad) (Completed)  .  Meds ordered this encounter  Medications   terbinafine (LAMISIL) 250 MG tablet    Sig: Take 1 tablet (250 mg total) by mouth daily.    Dispense:  90 tablet    Refill:  0    Order  Specific Question:   Supervising Provider    AnswerRochel Brome [phone]    Orders Placed This Encounter  Procedures   Flu Vaccine QUAD High Dose(Fluad)   CBC with Differential/Platelet   Comprehensive metabolic panel   TSH   Lipid panel   VITAMIN D 25 Hydroxy (Vit-D Deficiency, Fractures)     Follow-up: Return in about 6 months (around 08/07/2021) for follow up 6 months fasting.  An After Visit Summary was printed and given to the patient.  Yetta Flock [facility] [phone]

## 2021-02-08 LAB — COMPREHENSIVE METABOLIC PANEL
ALT: 18 IU/L (ref 0–32)
AST: 16 IU/L (ref 0–40)
Albumin/Globulin Ratio: 1.6 (ref 1.2–2.2)
Albumin: 4.1 g/dL (ref 3.8–4.8)
Alkaline Phosphatase: 98 IU/L (ref 44–121)
BUN/Creatinine Ratio: 18 (ref 12–28)
BUN: 13 mg/dL (ref 8–27)
Bilirubin Total: 0.5 mg/dL (ref 0.0–1.2)
CO2: 25 mmol/L (ref 20–29)
Calcium: 9.3 mg/dL (ref 8.7–10.3)
Chloride: 103 mmol/L (ref 96–106)
Creatinine, Ser: 0.74 mg/dL (ref 0.57–1.00)
Globulin, Total: 2.5 g/dL (ref 1.5–4.5)
Glucose: 97 mg/dL (ref 65–99)
Potassium: 4.9 mmol/L (ref 3.5–5.2)
Sodium: 140 mmol/L (ref 134–144)
Total Protein: 6.6 g/dL (ref 6.0–8.5)
eGFR: 90 mL/min/{1.73_m2} (ref >59)

## 2021-02-08 LAB — CBC WITH DIFFERENTIAL/PLATELET
Basophils Absolute: 0 10*3/uL (ref 0.0–0.2)
Basos: 1 %
EOS (ABSOLUTE): 0.1 10*3/uL (ref 0.0–0.4)
Eos: 2 %
Hematocrit: 44.3 % (ref 34.0–46.6)
Hemoglobin: 14.3 g/dL (ref 11.1–15.9)
Immature Grans (Abs): 0 10*3/uL (ref 0.0–0.1)
Immature Granulocytes: 0 %
Lymphocytes Absolute: 2 10*3/uL (ref 0.7–3.1)
Lymphs: 43 %
MCH: 30.2 pg (ref 26.6–33.0)
MCHC: 32.3 g/dL (ref 31.5–35.7)
MCV: 94 fL (ref 79–97)
Monocytes Absolute: 0.3 10*3/uL (ref 0.1–0.9)
Monocytes: 7 %
Neutrophils Absolute: 2.2 10*3/uL (ref 1.4–7.0)
Neutrophils: 47 %
Platelets: 224 10*3/uL (ref 150–450)
RBC: 4.73 x10E6/uL (ref 3.77–5.28)
RDW: 13.1 % (ref 11.7–15.4)
WBC: 4.7 10*3/uL (ref 3.4–10.8)

## 2021-02-08 LAB — TSH: TSH: 2.15 u[IU]/mL (ref 0.450–4.500)

## 2021-02-08 LAB — CARDIOVASCULAR RISK ASSESSMENT

## 2021-02-08 LAB — LIPID PANEL
Chol/HDL Ratio: 3.1 ratio (ref 0.0–4.4)
Cholesterol, Total: 187 mg/dL (ref 100–199)
HDL: 61 mg/dL (ref >39)
LDL Chol Calc (NIH): 109 mg/dL — ABNORMAL HIGH (ref 0–99)
Triglycerides: 95 mg/dL (ref 0–149)
VLDL Cholesterol Cal: 17 mg/dL (ref 5–40)

## 2021-02-08 LAB — VITAMIN D 25 HYDROXY (VIT D DEFICIENCY, FRACTURES): Vit D, 25-Hydroxy: 43 ng/mL (ref 30.0–100.0)

## 2021-03-05 ENCOUNTER — Ambulatory Visit
Admission: RE | Admit: 2021-03-05 | Discharge: 2021-03-05 | Disposition: A | Discharge status: Home / Self Care | Payer: 59 | Source: Ambulatory Visit | Attending: Thoracic Surgery (Cardiothoracic Vascular Surgery) | Admitting: Thoracic Surgery (Cardiothoracic Vascular Surgery)

## 2021-03-05 ENCOUNTER — Other Ambulatory Visit: Payer: Self-pay

## 2021-03-05 DIAGNOSIS — I712 Thoracic aortic aneurysm, without rupture, unspecified: Secondary | ICD-10-CM

## 2021-03-05 MED ORDER — GADOBENATE DIMEGLUMINE 529 MG/ML IV SOLN
20.0000 mL | Freq: Once | INTRAVENOUS | Status: AC | PRN
  Administered 2021-03-05: 20 mL via INTRAVENOUS

## 2021-03-09 ENCOUNTER — Other Ambulatory Visit: Payer: Self-pay | Admitting: Physician Assistant

## 2021-03-14 ENCOUNTER — Ambulatory Visit (INDEPENDENT_AMBULATORY_CARE_PROVIDER_SITE_OTHER): Payer: 59 | Admitting: Physician Assistant

## 2021-03-14 ENCOUNTER — Other Ambulatory Visit: Payer: Self-pay

## 2021-03-14 VITALS — BP 114/70 | HR 80 | Resp 20 | Ht 67.0 in | Wt 267.0 lb

## 2021-03-14 DIAGNOSIS — I712 Thoracic aortic aneurysm, without rupture, unspecified: Secondary | ICD-10-CM

## 2021-03-14 NOTE — Progress Notes (Signed)
[address]       [place] [address]             [phone]     HPI: [name] returns for scheduled annual follow-up visit regarding her ascending aneurysm  She speaks limited English and is accompanied by her daughter who assisted with translation.  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  In June 2018 she had a knee replacement.  A couple of months later she developed chest pain and shortness of breath.  A CT showed pulmonary emboli.  She was also noted to have a 4 cm ascending aneurysm.  She was seen by Dr. Roxan Hockey about a year ago.  She was doing well at that time.  The aneurysm was stable.  Over the past year she has not had any chest pain, pressure, or tightness.  She does have chronic back and hip pain and is on oxycodone for that.  She also takes Flexeril and Mobic.  She has some chronic swelling in her legs but nothing is changed with that recently.  She has no new medical conditions at this time.  Her chronic back pain and hip pain have limited her activity.   Past Medical History:  Diagnosis Date   Anxiety    Arthritis    Degenerative disc disease    Depression    GERD (gastroesophageal reflux disease)    Headache 10/08/2018   Hypertension    Morbid obesity (HCC)    Sleep apnea    uses a c-pap    Current Outpatient Medications  Medication Sig Dispense Refill   clotrimazole-betamethasone (LOTRISONE) cream APPLY SMALL AMOUNT TO AFFECTED AREA 2 TIMES A DAY AS NEEDED 45 g 2   cyclobenzaprine (FLEXERIL) 10 MG tablet Take by mouth.     DULoxetine (CYMBALTA) 60 MG capsule TAKE 1 CAPSULE BY MOUTH EVERY DAY 90 capsule 2   ergocalciferol (VITAMIN D2) 1.25 MG (50000 UT) capsule Take 1 capsule (50,000 Units total) by mouth once a week. 12 capsule 1   fluticasone (FLONASE)  50 MCG/ACT nasal spray SPRAY 2 SPRAYS INTO EACH NOSTRIL EVERY DAY 16 mL 5   furosemide (LASIX) 20 MG tablet Take 20 mg by mouth daily.     meloxicam (MOBIC) 15 MG tablet TAKE 1 TABLET (15 MG TOTAL) AT BEDTIME 90 tablet 1   omeprazole (PRILOSEC) 20 MG capsule Take by mouth.     oxyCODONE-acetaminophen (PERCOCET) 7.5-325 MG tablet Take by mouth.     pravastatin (PRAVACHOL) 40 MG tablet Take 1 tablet (40 mg total) by mouth daily. 90 tablet 1   No current facility-administered medications for this visit.    Physical Exam Vitals:   03/14/21 1133  BP: 114/70  Pulse: 80  Resp: 20  SpO51: 28%    65 year old woman in no acute distress Alert and oriented x3 with no focal motor deficit Neck without carotid bruit or significant JVD Cardiac regular rate and rhythm with normal S1 and S2 Lungs clear bilaterally Bilateral nonpitting pedal edema  Diagnostic Tests:  CLINICAL DATA:  Follow-up TAA   EXAM: MRA CHEST WITH OR WITHOUT CONTRAST   TECHNIQUE: Angiographic images of the chest were obtained using MRA technique with intravenous contrast.   CONTRAST:  29mL MULTIHANCE GADOBENATE DIMEGLUMINE 529 MG/ML IV SOLN   COMPARISON:  CT chest angiogram, 03/08/2020   FINDINGS:  Cardiovascular: Unchanged caliber of the tubular ascending thoracic aorta, measuring up to 4.0 x 3.9 cm. Normal caliber of the descending thoracic aorta measuring up to 2.5 x 2.4 cm. Mild cardiomegaly. No pericardial effusion.   Mediastinum/Nodes: No enlarged mediastinal, hilar, or axillary lymph nodes. Thyroid gland, trachea, and esophagus demonstrate no significant findings.   Limited lungs/Pleura: No obvious abnormality of the lungs. No pleural effusion or pneumothorax.   Upper Abdomen: No acute abnormality.   Musculoskeletal: No chest wall mass or suspicious bone lesions identified.   IMPRESSION: 1. Unchanged caliber of the tubular ascending thoracic aorta, measuring up to 4.0 x 3.9 cm. Recommend annual  imaging followup by CTA or MRA. This recommendation follows 2010 ACCF/AHA/AATS/ACR/ASA/SCA/SCAI/SIR/STS/SVM Guidelines for the Diagnosis and Management of Patients with Thoracic Aortic Disease. Circulation. 2010; 121: Q259-D638. Aortic aneurysm NOS (ICD10-I71.9) 2. Mild cardiomegaly.     Electronically Signed   By: Delanna Ahmadi M.D.   On: 03/05/2021 14:59   CT ANGIOGRAPHY CHEST WITH CONTRAST   TECHNIQUE: Multidetector CT imaging of the chest was performed using the standard protocol during bolus administration of intravenous contrast. Multiplanar CT image reconstructions and MIPs were obtained to evaluate the vascular anatomy.   CONTRAST:  60mL ISOVUE-370 IOPAMIDOL (ISOVUE-370) INJECTION 76%   COMPARISON:  CT chest dated March 13, 2019.   FINDINGS: Cardiovascular: Unchanged 4.1 cm ascending thoracic aortic aneurysm. No dissection. Normal heart size. No pericardial effusion. No central pulmonary embolism.   Mediastinum/Nodes: No enlarged mediastinal, hilar, or axillary lymph nodes. Thyroid gland, trachea, and esophagus demonstrate no significant findings.   Lungs/Pleura: Lungs are clear. No pleural effusion or pneumothorax.   Upper Abdomen: No acute abnormality.   Musculoskeletal: No chest wall abnormality. No acute or significant osseous findings.   Review of the MIP images confirms the above findings.   IMPRESSION: 1. Unchanged 4.1 cm ascending thoracic aortic aneurysm. Recommend annual imaging followup by CTA or MRA. This recommendation follows 2010 ACCF/AHA/AATS/ACR/ASA/SCA/SCAI/SIR/STS/SVM Guidelines for the Diagnosis and Management of Patients with Thoracic Aortic Disease. Circulation. 2010; 121: V564-P329. Aortic aneurysm NOS (ICD10-I71.9)     Electronically Signed   By: Titus Dubin M.D.   On: 03/08/2020 12:38 I personally reviewed the CT images and concur with the findings noted above  Impression:  Zaharah Amir is a 65 year old Hispanic  woman with a past history significant for hypertension, obesity, degenerative joint disease, pulmonary embolism following knee replacement, an ascending aneurysm, and chronic back and hip pain.  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  She has been followed since that time.  Her ascending aneurysm is stable today on MRA measuring 4.0 cm x 3.9 cm.  She prefers the MRA to the CT to limit radiation.  Hypertension-blood pressure well controlled, she is currently not on any agents.  Her primary care does check her blood pressure occasionally.  I suggested getting a blood pressure cuff for home so she can check it daily and write down those readings.  I suggested increasing her activity level as tolerated.  I know it can be difficult with chronic pain however she does have a recumbent bike at home which she can use.  The goal would be 30 minutes of exercise starting at 3 times a week and increasing from there.  She lives with her daughter and has been working on her diet.  They have switched from white rice to brown rice and have been  trying to incorporate more fresh vegetables and lean protein.  We discussed a low-sodium diet.  No heavy lifting.  Avoid fluoroquinolones.  Plan: Return in 1 year with MR angiogram of chest  I spent over 20 minutes in review of images, records, and in consultation with Mrs. Cordero today.    Nicholes Rough, PA-C [facility] [phone]

## 2021-04-21 ENCOUNTER — Telehealth: Payer: Self-pay

## 2021-04-21 NOTE — Telephone Encounter (Signed)
Patient daughter called and stated patient is suppose to go for a follow up at [facility] for a mammogram. Will need the order put in before they can schedule appointment. Please advise

## 2021-04-22 ENCOUNTER — Other Ambulatory Visit: Payer: Self-pay | Admitting: Physician Assistant

## 2021-04-22 DIAGNOSIS — R928 Other abnormal and inconclusive findings on diagnostic imaging of breast: Secondary | ICD-10-CM

## 2021-04-26 ENCOUNTER — Other Ambulatory Visit: Payer: Self-pay | Admitting: Physician Assistant

## 2021-04-26 DIAGNOSIS — R0600 Dyspnea, unspecified: Secondary | ICD-10-CM

## 2021-04-26 DIAGNOSIS — B351 Tinea unguium: Secondary | ICD-10-CM

## 2021-05-05 ENCOUNTER — Telehealth: Payer: Self-pay | Admitting: Physician Assistant

## 2021-05-05 NOTE — Telephone Encounter (Signed)
° °  Linda Acevedo has been scheduled for the following appointment:  WHAT: DIAGNOSTIC MAMMOGRAM WHERE: [facility] DATE: 06/01/21 TIME: 9:30 AM ARRIVAL TIME  Patient has been made aware. SPOKE TO PT'S DAUGHTER

## 2021-05-10 ENCOUNTER — Telehealth: Payer: Self-pay

## 2021-05-10 NOTE — Telephone Encounter (Signed)
Patient daughter made aware of information. Verbalized understanding.

## 2021-05-10 NOTE — Telephone Encounter (Signed)
Patient called and stated been sick since Friday and tested positive for Covid today. Patient is having congestion, ST. Please advise

## 2021-05-17 ENCOUNTER — Other Ambulatory Visit: Payer: Self-pay | Admitting: Physician Assistant

## 2021-05-30 ENCOUNTER — Other Ambulatory Visit: Payer: Self-pay | Admitting: Physician Assistant

## 2021-06-02 ENCOUNTER — Other Ambulatory Visit: Payer: Self-pay

## 2021-06-02 DIAGNOSIS — R928 Other abnormal and inconclusive findings on diagnostic imaging of breast: Secondary | ICD-10-CM

## 2021-06-03 ENCOUNTER — Telehealth: Payer: Self-pay

## 2021-06-03 NOTE — Telephone Encounter (Signed)
I gave the results of Right breast diagnostic mammogram to her daughter Andrew Au.

## 2021-07-11 ENCOUNTER — Ambulatory Visit: Payer: 59 | Admitting: Neurology

## 2021-08-01 ENCOUNTER — Other Ambulatory Visit: Payer: Self-pay | Admitting: Physician Assistant

## 2021-08-07 ENCOUNTER — Other Ambulatory Visit: Payer: Self-pay | Admitting: Physician Assistant

## 2021-08-07 DIAGNOSIS — R0600 Dyspnea, unspecified: Secondary | ICD-10-CM

## 2021-08-08 ENCOUNTER — Other Ambulatory Visit: Payer: Self-pay

## 2021-08-08 ENCOUNTER — Ambulatory Visit (INDEPENDENT_AMBULATORY_CARE_PROVIDER_SITE_OTHER): Payer: 59 | Admitting: Physician Assistant

## 2021-08-08 ENCOUNTER — Encounter: Payer: Self-pay | Admitting: Physician Assistant

## 2021-08-08 VITALS — BP 124/74 | HR 74 | Temp 98.8°F | Resp 18 | Ht 67.0 in | Wt 267.2 lb

## 2021-08-08 DIAGNOSIS — Z23 Encounter for immunization: Secondary | ICD-10-CM | POA: Diagnosis not present

## 2021-08-08 DIAGNOSIS — R600 Localized edema: Secondary | ICD-10-CM | POA: Diagnosis not present

## 2021-08-08 DIAGNOSIS — E559 Vitamin D deficiency, unspecified: Secondary | ICD-10-CM | POA: Diagnosis not present

## 2021-08-08 DIAGNOSIS — R739 Hyperglycemia, unspecified: Secondary | ICD-10-CM

## 2021-08-08 DIAGNOSIS — E782 Mixed hyperlipidemia: Secondary | ICD-10-CM

## 2021-08-08 DIAGNOSIS — Z1211 Encounter for screening for malignant neoplasm of colon: Secondary | ICD-10-CM

## 2021-08-08 NOTE — Progress Notes (Signed)
? ?Established Patient Office Visit ? ?Subjective:  ?Patient ID: Linda Acevedo, female    DOB: 1955-06-19  Age: 66 y.o. MRN: [id number] ? ?CC:  ?Chief Complaint  ?Patient presents with  ? Hyperlipidemia  ? ? ?HPI ?Linda Acevedo presents for follow up hyperlipidemia and chronic back pain ? ?Pt currently on pravachol 46m qd for hyperlipidemia - is tolerating well and voices no problems or concerns ? ?She is currently following with [facility] for chronic low back pain- she is currently taking percocet, flexeril and she also has lidocaine patch ?She states she has pain in her lower back with occasional pain that radiates down both legs but right now pain is manageable ? ?Pt with history of vit D def - is taking weekly supplement - due for labwork ? ?Pt with history of anxiety and depression -is currently on cymbalta [id number] ?Tolerating medication well ? ?Pt with history of GERD - doing well on prilosec 2052md ? ?Pt uses lasix qd for dependent edema - symptoms stable and voices no concerns ? ?Past Medical History:  ?Diagnosis Date  ? Anxiety   ? Arthritis   ? Degenerative disc disease   ? Depression   ? GERD (gastroesophageal reflux disease)   ? Headache 10/08/2018  ? Hypertension   ? [place]bid [place] ([place] ? Sleep apnea   ? uses a c-pap  ? ? ?Past Surgical History:  ?Procedure Laterality Date  ? ABLATION SAPHENOUS VEIN W/ RFA    ? Bil  ? COLONOSCOPY    ? JOINT REPLACEMENT    ? knee/right  ? ? ?Family History  ?Problem Relation Age of Onset  ? Diabetes Mother   ? Cancer Father   ? ? ?Social History  ? ?Socioeconomic History  ? Marital status: Legally Separated  ?  Spouse name: Not on file  ? Number of children: 2  ? Years of education: Not on file  ? Highest education level: Not on file  ?Occupational History  ? Occupation: homemaker  ?Tobacco Use  ? Smoking status: Never  ? Smokeless tobacco: Never  ?Vaping Use  ? Vaping Use: Never used  ?Substance and Sexual Activity  ? Alcohol use: Never  ?  Drug use: No  ? Sexual activity: Never  ?  Partners: Male  ?Other Topics Concern  ? Not on file  ?Social History Narrative  ? Lives with daughter YazMacon Large Right handed   ? 2 cups of coffee daily   ? ?Social Determinants of Health  ? ?Financial Resource Strain: Not on file  ?Food Insecurity: Not on file  ?Transportation Needs: Not on file  ?Physical Activity: Not on file  ?Stress: Not on file  ?Social Connections: Not on file  ?Intimate Partner Violence: Not on file  ? ? ? ?Current Outpatient Medications:  ?  albuterol (VENTOLIN HFA) 108 (90 Base) MCG/ACT inhaler, TAKE 2 PUFFS BY MOUTH EVERY 6 HOURS AS NEEDED FOR WHEEZE OR SHORTNESS OF BREATH, Disp: 8.5 each, Rfl: 1 ?  clotrimazole-betamethasone (LOTRISONE) cream, APPLY SMALL AMOUNT TO AFFECTED AREA 2 TIMES A DAY AS NEEDED, Disp: 45 g, Rfl: 2 ?  cyclobenzaprine (FLEXERIL) 10 MG tablet, Take by mouth., Disp: , Rfl:  ?  DULoxetine (CYMBALTA) 60 MG capsule, TAKE 1 CAPSULE BY MOUTH EVERY DAY, Disp: 90 capsule, Rfl: 2 ?  fluticasone (FLONASE) 50 MCG/ACT nasal spray, SPRAY 2 SPRAYS INTO EACH NOSTRIL EVERY DAY, Disp: 48 mL, Rfl: 1 ?  furosemide (LASIX) 20 MG tablet,  TAKE 1 TABLET BY MOUTH EVERY DAY AS DIRECTED, Disp: 90 tablet, Rfl: 1 ?  lidocaine (LIDODERM) 5 %, SMARTSIG:Patch(s) Topical, Disp: , Rfl:  ?  meloxicam (MOBIC) 15 MG tablet, TAKE 1 TABLET (15 MG TOTAL) AT BEDTIME, Disp: 90 tablet, Rfl: 1 ?  omeprazole (PRILOSEC) 20 MG capsule, TAKE 1 CAPSULE BY MOUTH EVERY DAY, Disp: 90 capsule, Rfl: 0 ?  oxyCODONE-acetaminophen (PERCOCET) 7.5-325 MG tablet, Take by mouth., Disp: , Rfl:  ?  pravastatin (PRAVACHOL) 40 MG tablet, TAKE 1 TABLET BY MOUTH EVERY DAY, Disp: 90 tablet, Rfl: 1 ?  terbinafine (LAMISIL) 250 MG tablet, Take 1 tablet (250 mg total) by mouth daily., Disp: 90 tablet, Rfl: 0 ?  Vitamin D, Ergocalciferol, (DRISDOL) 1.25 MG (50000 UNIT) CAPS capsule, TAKE 1 CAPSULE BY MOUTH ONE TIME PER WEEK, Disp: 12 capsule, Rfl: 1 ? ? ?Allergies  ?Allergen Reactions  ?  Gabapentin Swelling and Anaphylaxis  ?  Face swelling  ? ?CONSTITUTIONAL: Negative for chills, fatigue, fever, unintentional weight gain and unintentional weight loss.  ?E/N/T: Negative for ear pain, nasal congestion and sore throat.  ?CARDIOVASCULAR: Negative for chest pain, dizziness, palpitations and pedal edema.  ?RESPIRATORY: Negative for recent cough and dyspnea.  ?GASTROINTESTINAL: Negative for abdominal pain, acid reflux symptoms, constipation, diarrhea, nausea and vomiting.  ?MSK: Negative for arthralgias and myalgias.  ?INTEGUMENTARY: Negative for rash.  ?NEUROLOGICAL: Negative for dizziness and headaches.  ?PSYCHIATRIC: Negative for sleep disturbance and to question depression screen.  Negative for depression, negative for anhedonia.  ?   ? ? ?  ?Objective:  ?  ?PHYSICAL EXAM:  ? ?VS: BP 124/74   Pulse 74   Temp 98.8 ?F (37.1 ?C)   Resp 18   Ht _0  (1.702 m)   Wt 267 lb 3.2 oz (121.2 kg)   SpO2 98%   BMI 41.85 kg/m?  ? ?GEN: Well nourished, well developed, in no acute distress  ?HEENT: normal external ears and nose - normal external auditory canals and TMS - - Lips, Teeth and Gums - normal  ?Oropharynx - normal mucosa, palate, and posterior pharynx ?Neck: no JVD or masses - no thyromegaly ?Cardiac: RRR; no murmurs, rubs, or gallops,no edema - no significant varicosities ?Respiratory:  normal respiratory rate and pattern with no distress - normal breath sounds with no rales, rhonchi, wheezes or rubs ?GI: normal bowel sounds, no masses or tenderness ?Skin: warm and dry, no rash  ?Psych: euthymic mood, appropriate affect and demeanor ? ? ?There are no preventive care reminders to display for this patient. ? ? ?There are no preventive care reminders to display for this patient. ? ?Lab Results  ?Component Value Date  ? TSH 2.150 02/07/2021  ? ?Lab Results  ?Component Value Date  ? WBC 4.7 02/07/2021  ? HGB 14.3 02/07/2021  ? HCT 44.3 02/07/2021  ? MCV 94 02/07/2021  ? PLT 224 02/07/2021  ? ?Lab  Results  ?Component Value Date  ? NA 140 02/07/2021  ? K 4.9 02/07/2021  ? CO2 25 02/07/2021  ? GLUCOSE 97 02/07/2021  ? BUN 13 02/07/2021  ? CREATININE 0.74 02/07/2021  ? BILITOT 0.5 02/07/2021  ? ALKPHOS 98 02/07/2021  ? AST 16 02/07/2021  ? ALT 18 02/07/2021  ? PROT 6.6 02/07/2021  ? ALBUMIN 4.1 02/07/2021  ? CALCIUM 9.3 02/07/2021  ? EGFR 90 02/07/2021  ? ?Lab Results  ?Component Value Date  ? CHOL 187 02/07/2021  ? ?Lab Results  ?Component Value Date  ? HDL 61 02/07/2021  ? ?  Lab Results  ?Component Value Date  ? LDLCALC 109 (H) 02/07/2021  ? ?Lab Results  ?Component Value Date  ? TRIG 95 02/07/2021  ? ?Lab Results  ?Component Value Date  ? CHOLHDL 3.1 02/07/2021  ? ?No results found for: HGBA1C ? ?  ?Assessment & Plan:  ? ?Problem List Items Addressed This Visit   ? ?  ? Other  ? Edema  ? Relevant Orders  ? CBC with Differential/Platelet  ? Comprehensive metabolic panel  ? TSH ?Continue lasix as directed  ? Vitamin D deficiency  ? Relevant Orders  ? VITAMIN D 25 Hydroxy (Vit-D Deficiency, Fractures) ?Continue vit d qd  ? Mixed hyperlipidemia - Primary  ? Relevant Orders  ? Lipid panel ?Watch diet ?Continue pravachol  ? Need for prophylactic vaccination against Streptococcus pneumoniae (pneumococcus)  ? Relevant Orders  ? Pneumococcal polysaccharide vaccine 23-valent greater than or equal to 2yo subcutaneous/IM  ? ?Other Visit Diagnoses   ? ? Hyperglycemia      ? Relevant Orders  ? Comprehensive metabolic panel  ? Hemoglobin A1c  ? Colon cancer screening      ? Relevant Orders  ? Ambulatory referral to Gastroenterology  ? ?  ? ? ?No orders of the defined types were placed in this encounter. ? ? ?Follow-up: Return in about 6 months (around 02/08/2022) for chronic fasting follow up.  ? ? ?SARA R Heith Haigler, PA-C ?

## 2021-08-09 LAB — CBC WITH DIFFERENTIAL/PLATELET
Basophils Absolute: 0 10*3/uL (ref 0.0–0.2)
Basos: 1 %
EOS (ABSOLUTE): 0.1 10*3/uL (ref 0.0–0.4)
Eos: 2 %
Hematocrit: 42 % (ref 34.0–46.6)
Hemoglobin: 14.4 g/dL (ref 11.1–15.9)
Immature Grans (Abs): 0 10*3/uL (ref 0.0–0.1)
Immature Granulocytes: 0 %
Lymphocytes Absolute: 2.2 10*3/uL (ref 0.7–3.1)
Lymphs: 45 %
MCH: 31.1 pg (ref 26.6–33.0)
MCHC: 34.3 g/dL (ref 31.5–35.7)
MCV: 91 fL (ref 79–97)
Monocytes Absolute: 0.3 10*3/uL (ref 0.1–0.9)
Monocytes: 5 %
Neutrophils Absolute: 2.3 10*3/uL (ref 1.4–7.0)
Neutrophils: 47 %
Platelets: 216 10*3/uL (ref 150–450)
RBC: 4.63 x10E6/uL (ref 3.77–5.28)
RDW: 13.1 % (ref 11.7–15.4)
WBC: 4.8 10*3/uL (ref 3.4–10.8)

## 2021-08-09 LAB — LIPID PANEL
Chol/HDL Ratio: 3 ratio (ref 0.0–4.4)
Cholesterol, Total: 195 mg/dL (ref 100–199)
HDL: 64 mg/dL (ref >39)
LDL Chol Calc (NIH): 116 mg/dL — ABNORMAL HIGH (ref 0–99)
Triglycerides: 82 mg/dL (ref 0–149)
VLDL Cholesterol Cal: 15 mg/dL (ref 5–40)

## 2021-08-09 LAB — COMPREHENSIVE METABOLIC PANEL
ALT: 17 IU/L (ref 0–32)
AST: 22 IU/L (ref 0–40)
Albumin/Globulin Ratio: 1.6 (ref 1.2–2.2)
Albumin: 4.2 g/dL (ref 3.8–4.8)
Alkaline Phosphatase: 94 IU/L (ref 44–121)
BUN/Creatinine Ratio: 23 (ref 12–28)
BUN: 18 mg/dL (ref 8–27)
Bilirubin Total: 0.4 mg/dL (ref 0.0–1.2)
CO2: 24 mmol/L (ref 20–29)
Calcium: 9.3 mg/dL (ref 8.7–10.3)
Chloride: 104 mmol/L (ref 96–106)
Creatinine, Ser: 0.78 mg/dL (ref 0.57–1.00)
Globulin, Total: 2.6 g/dL (ref 1.5–4.5)
Glucose: 97 mg/dL (ref 70–99)
Potassium: 4.4 mmol/L (ref 3.5–5.2)
Sodium: 142 mmol/L (ref 134–144)
Total Protein: 6.8 g/dL (ref 6.0–8.5)
eGFR: 84 mL/min/{1.73_m2} (ref >59)

## 2021-08-09 LAB — HEMOGLOBIN A1C
Est. average glucose Bld gHb Est-mCnc: 117 mg/dL
Hgb A1c MFr Bld: 5.7 % — ABNORMAL HIGH (ref 4.8–5.6)

## 2021-08-09 LAB — VITAMIN D 25 HYDROXY (VIT D DEFICIENCY, FRACTURES): Vit D, 25-Hydroxy: 63.1 ng/mL (ref 30.0–100.0)

## 2021-08-09 LAB — TSH: TSH: 3.19 u[IU]/mL (ref 0.450–4.500)

## 2021-08-09 LAB — CARDIOVASCULAR RISK ASSESSMENT

## 2021-09-29 ENCOUNTER — Other Ambulatory Visit: Payer: Self-pay | Admitting: Physician Assistant

## 2021-11-22 ENCOUNTER — Other Ambulatory Visit: Payer: Self-pay | Admitting: Family Medicine

## 2021-11-22 ENCOUNTER — Other Ambulatory Visit: Payer: Self-pay | Admitting: Physician Assistant

## 2021-11-28 ENCOUNTER — Telehealth: Payer: Self-pay | Admitting: Physician Assistant

## 2021-11-28 ENCOUNTER — Other Ambulatory Visit: Payer: Self-pay | Admitting: Physician Assistant

## 2021-11-28 DIAGNOSIS — R928 Other abnormal and inconclusive findings on diagnostic imaging of breast: Secondary | ICD-10-CM

## 2021-11-28 NOTE — Telephone Encounter (Signed)
Patient daughter called regarding letter received by [facility] needing 6 month right diagnostic mammogram and ultrasound, can you please order at [facility]

## 2021-11-28 NOTE — Telephone Encounter (Signed)
   Linda Acevedo has been scheduled for the following appointment:  WHAT: DIAGNOSTIC MAMMOGRAM WHERE: [facility] DATE: 12/12/21 TIME: 1:50 PM CHECK-IN  Patient has been made aware.

## 2021-12-09 ENCOUNTER — Telehealth: Payer: Self-pay

## 2021-12-09 NOTE — Telephone Encounter (Signed)
[facility] called requesting new order for patient's diagnostic mammogram as current only has right breast, when bilateral is needed. They request new order stating bilateral diagnostic mammogram with right [place].   Secure chat sent to provider and her nurse.   Harrell Lark 12/09/21 9:13 AM

## 2021-12-13 ENCOUNTER — Other Ambulatory Visit: Payer: Self-pay

## 2021-12-13 DIAGNOSIS — R928 Other abnormal and inconclusive findings on diagnostic imaging of breast: Secondary | ICD-10-CM

## 2022-01-23 ENCOUNTER — Other Ambulatory Visit: Payer: Self-pay | Admitting: Physician Assistant

## 2022-01-27 ENCOUNTER — Other Ambulatory Visit: Payer: Self-pay | Admitting: Thoracic Surgery (Cardiothoracic Vascular Surgery)

## 2022-01-27 DIAGNOSIS — I7121 Aneurysm of the ascending aorta, without rupture: Secondary | ICD-10-CM

## 2022-02-02 ENCOUNTER — Ambulatory Visit
Admission: RE | Admit: 2022-02-02 | Discharge: 2022-02-02 | Disposition: A | Discharge status: Home / Self Care | Payer: 59 | Source: Ambulatory Visit | Attending: Thoracic Surgery (Cardiothoracic Vascular Surgery) | Admitting: Thoracic Surgery (Cardiothoracic Vascular Surgery)

## 2022-02-02 DIAGNOSIS — I7121 Aneurysm of the ascending aorta, without rupture: Secondary | ICD-10-CM

## 2022-02-02 MED ORDER — GADOBENATE DIMEGLUMINE 529 MG/ML IV SOLN
20.0000 mL | Freq: Once | INTRAVENOUS | Status: AC | PRN
  Administered 2022-02-02: 20 mL via INTRAVENOUS

## 2022-02-13 ENCOUNTER — Encounter: Payer: Self-pay | Admitting: Physician Assistant

## 2022-02-13 ENCOUNTER — Ambulatory Visit (INDEPENDENT_AMBULATORY_CARE_PROVIDER_SITE_OTHER): Payer: 59 | Admitting: Physician Assistant

## 2022-02-13 VITALS — BP 140/84 | HR 76 | Temp 97.3°F | Ht 67.0 in | Wt 277.0 lb

## 2022-02-13 DIAGNOSIS — M51369 Other intervertebral disc degeneration, lumbar region without mention of lumbar back pain or lower extremity pain: Secondary | ICD-10-CM

## 2022-02-13 DIAGNOSIS — E559 Vitamin D deficiency, unspecified: Secondary | ICD-10-CM

## 2022-02-13 DIAGNOSIS — E782 Mixed hyperlipidemia: Secondary | ICD-10-CM | POA: Diagnosis not present

## 2022-02-13 DIAGNOSIS — M5136 Other intervertebral disc degeneration, lumbar region: Secondary | ICD-10-CM

## 2022-02-13 DIAGNOSIS — F419 Anxiety disorder, unspecified: Secondary | ICD-10-CM

## 2022-02-13 DIAGNOSIS — K2100 Gastro-esophageal reflux disease with esophagitis, without bleeding: Secondary | ICD-10-CM

## 2022-02-13 DIAGNOSIS — Z1211 Encounter for screening for malignant neoplasm of colon: Secondary | ICD-10-CM

## 2022-02-13 NOTE — Progress Notes (Signed)
Subjective:  Patient ID: Linda Acevedo, female    DOB: [birth year]  Age: 66 y.o. MRN: [id number]  Chief Complaint  Patient presents with   Hyperlipidemia    HPI  Mixed hyperlipidemia  Pt presents with hyperlipidemia.  Compliance with treatment has been good The patient is compliant with medications, maintains a low cholesterol diet , follows up as directed , and maintains an exercise regimen . The patient denies experiencing any hypercholesterolemia related symptoms. Currenlty on pravachol '40mg'$  qd  Pt with history of chronic low back pain - has been seeing [facility] for this issue - states symptoms are stable at this time and she is currently taking flexeril, percocet , mobic and uses lidoderm patch  Pt with history of intermittent lower extremity edema - uses lasix '20mg'$    Pt with history of anxiety - stable on cymbalta '60mg'$   Pt would like referral to Dr Lyndel Safe for colonoscopy after first of year  Pt with history of vit D def - taking otc medds because rx was too expensive - due for labwork  Current Outpatient Medications on File Prior to Visit  Medication Sig Dispense Refill   albuterol (VENTOLIN HFA) 108 (90 Base) MCG/ACT inhaler TAKE 2 PUFFS BY MOUTH EVERY 6 HOURS AS NEEDED FOR WHEEZE OR SHORTNESS OF BREATH 8.5 each 1   clotrimazole-betamethasone (LOTRISONE) cream APPLY SMALL AMOUNT TO AFFECTED AREA 2 TIMES A DAY AS NEEDED 45 g 2   cyclobenzaprine (FLEXERIL) 10 MG tablet Take by mouth.     DULoxetine (CYMBALTA) 60 MG capsule TAKE 1 CAPSULE BY MOUTH EVERY DAY 90 capsule 2   fluticasone (FLONASE) 50 MCG/ACT nasal spray SPRAY 2 SPRAYS INTO EACH NOSTRIL EVERY DAY 48 mL 1   furosemide (LASIX) 20 MG tablet TAKE 1 TABLET BY MOUTH EVERY DAY AS DIRECTED 90 tablet 1   lidocaine (LIDODERM) 5 % SMARTSIG:Patch(s) Topical     meloxicam (MOBIC) 15 MG tablet TAKE 1 TABLET (15 MG TOTAL) AT BEDTIME 90 tablet 1   omeprazole (PRILOSEC) 20 MG capsule TAKE 1 CAPSULE BY MOUTH EVERY  DAY 90 capsule 0   oxyCODONE-acetaminophen (PERCOCET) 7.5-325 MG tablet Take by mouth.     pravastatin (PRAVACHOL) 40 MG tablet TAKE 1 TABLET BY MOUTH EVERY DAY 90 tablet 1   VITAMIN D PO Take by mouth.     No current facility-administered medications on file prior to visit.   Past Medical History:  Diagnosis Date   Anxiety    Arthritis    Degenerative disc disease    Depression    GERD (gastroesophageal reflux disease)    Headache 10/08/2018   Hypertension    Morbid obesity (HCC)    Sleep apnea    uses a c-pap   Past Surgical History:  Procedure Laterality Date   ABLATION SAPHENOUS VEIN W/ RFA     Bil   COLONOSCOPY     JOINT REPLACEMENT     knee/right    Family History  Problem Relation Age of Onset   Diabetes Mother    Cancer Father    Social History   Socioeconomic History   Marital status: Legally Separated    Spouse name: Not on file   Number of children: 2   Years of education: Not on file   Highest education level: Not on file  Occupational History   Occupation: homemaker  Tobacco Use   Smoking status: Never   Smokeless tobacco: Never  Vaping Use   Vaping Use: Never used  Substance and  Sexual Activity   Alcohol use: Never   Drug use: No   Sexual activity: Never    Partners: Male  Other Topics Concern   Not on file  Social History Narrative   Lives with daughter [name]   Right handed    2 cups of coffee daily    Social Determinants of Health   Financial Resource Strain: Not on file  Food Insecurity: Not on file  Transportation Needs: Not on file  Physical Activity: Not on file  Stress: Not on file  Social Connections: Not on file    Review of Systems CONSTITUTIONAL: Negative for chills, fatigue, fever, unintentional weight gain and unintentional weight loss.  E/N/T: Negative for ear pain, nasal congestion and sore throat.  CARDIOVASCULAR: Negative for chest pain, dizziness, palpitations and pedal edema.  RESPIRATORY: Negative for recent  cough and dyspnea.  GASTROINTESTINAL: Negative for abdominal pain, acid reflux symptoms, constipation, diarrhea, nausea and vomiting.  MSK: Negative for arthralgias and myalgias.  INTEGUMENTARY: Negative for rash.  NEUROLOGICAL: Negative for dizziness and headaches.  PSYCHIATRIC: Negative for sleep disturbance and to question depression screen.  Negative for depression, negative for anhedonia.       Objective:  PHYSICAL EXAM:   VS: BP (!) 140/84 (BP Location: Left Arm, Patient Position: Sitting)   Pulse 76   Temp (!) 97.3 F (36.3 C) (Temporal)   Ht '5\' 7"'$  (1.702 m)   Wt 277 lb (125.6 kg)   SpO2 98%   BMI 43.38 kg/m  Gen - alert, NAD Cardiac: RRR; no murmurs, rubs, or gallops,no edema - has varicose veins Respiratory:  normal respiratory rate and pattern with no distress - normal breath sounds with no rales, rhonchi, wheezes or rubs  MS: no deformity or atrophy  Skin: warm and dry, no rash  Neuro:  Alert and Oriented x 3, - CN II-Xii grossly intact Psych: euthymic mood, appropriate affect and demeanor   Lab Results  Component Value Date   WBC 4.8 08/08/2021   HGB 14.4 08/08/2021   HCT 42.0 08/08/2021   PLT 216 08/08/2021   GLUCOSE 97 08/08/2021   CHOL 195 08/08/2021   TRIG 82 08/08/2021   HDL 64 08/08/2021   LDLCALC 116 (H) 08/08/2021   ALT 17 08/08/2021   AST 22 08/08/2021   NA 142 08/08/2021   K 4.4 08/08/2021   CL 104 08/08/2021   CREATININE 0.78 08/08/2021   BUN 18 08/08/2021   CO2 24 08/08/2021   TSH 3.190 08/08/2021   HGBA1C 5.7 (H) 08/08/2021      Assessment & Plan:   Problem List Items Addressed This Visit       Musculoskeletal and Integument   Lumbar degenerative disc disease -  Continue meds and follow up with ortho as directed     Other   Anxiety   Relevant Orders   TSH Continue cymbalta   Vitamin D deficiency   Relevant Orders   VITAMIN D 25 Hydroxy (Vit-D Deficiency, Fractures) Continue otc vit D   Mixed hyperlipidemia   Relevant  Orders   CBC with Differential/Platelet   Comprehensive metabolic panel   Lipid panel Continue meds - watch diet   Other Visit Diagnoses     Gastroesophageal reflux disease with esophagitis without hemorrhage     Continue prilosec   Colon cancer screening       Relevant Orders   Ambulatory referral to Gastroenterology     .  No orders of the defined types were placed in this encounter.  Orders Placed This Encounter  Procedures   CBC with Differential/Platelet   Comprehensive metabolic panel   TSH   Lipid panel   VITAMIN D 25 Hydroxy (Vit-D Deficiency, Fractures)   Ambulatory referral to Gastroenterology     Follow-up: Return in about 6 months (around 08/14/2022) for chronic fasting follow up.  An After Visit Summary was printed and given to the patient.  Yetta Flock [facility] [phone]

## 2022-02-14 LAB — COMPREHENSIVE METABOLIC PANEL
ALT: 21 IU/L (ref 0–32)
AST: 23 IU/L (ref 0–40)
Albumin/Globulin Ratio: 1.7 (ref 1.2–2.2)
Albumin: 4 g/dL (ref 3.9–4.9)
Alkaline Phosphatase: 83 IU/L (ref 44–121)
BUN/Creatinine Ratio: 20 (ref 12–28)
BUN: 17 mg/dL (ref 8–27)
Bilirubin Total: 0.4 mg/dL (ref 0.0–1.2)
CO2: 23 mmol/L (ref 20–29)
Calcium: 9.3 mg/dL (ref 8.7–10.3)
Chloride: 104 mmol/L (ref 96–106)
Creatinine, Ser: 0.83 mg/dL (ref 0.57–1.00)
Globulin, Total: 2.4 g/dL (ref 1.5–4.5)
Glucose: 100 mg/dL — ABNORMAL HIGH (ref 70–99)
Potassium: 4.5 mmol/L (ref 3.5–5.2)
Sodium: 143 mmol/L (ref 134–144)
Total Protein: 6.4 g/dL (ref 6.0–8.5)
eGFR: 78 mL/min/{1.73_m2} (ref >59)

## 2022-02-14 LAB — CBC WITH DIFFERENTIAL/PLATELET
Basophils Absolute: 0 10*3/uL (ref 0.0–0.2)
Basos: 1 %
EOS (ABSOLUTE): 0.1 10*3/uL (ref 0.0–0.4)
Eos: 2 %
Hematocrit: 39.4 % (ref 34.0–46.6)
Hemoglobin: 13.4 g/dL (ref 11.1–15.9)
Immature Grans (Abs): 0 10*3/uL (ref 0.0–0.1)
Immature Granulocytes: 0 %
Lymphocytes Absolute: 1.7 10*3/uL (ref 0.7–3.1)
Lymphs: 42 %
MCH: 30.7 pg (ref 26.6–33.0)
MCHC: 34 g/dL (ref 31.5–35.7)
MCV: 90 fL (ref 79–97)
Monocytes Absolute: 0.3 10*3/uL (ref 0.1–0.9)
Monocytes: 6 %
Neutrophils Absolute: 2 10*3/uL (ref 1.4–7.0)
Neutrophils: 49 %
Platelets: 209 10*3/uL (ref 150–450)
RBC: 4.37 x10E6/uL (ref 3.77–5.28)
RDW: 12.8 % (ref 11.7–15.4)
WBC: 4 10*3/uL (ref 3.4–10.8)

## 2022-02-14 LAB — LIPID PANEL
Chol/HDL Ratio: 2.4 ratio (ref 0.0–4.4)
Cholesterol, Total: 185 mg/dL (ref 100–199)
HDL: 76 mg/dL (ref >39)
LDL Chol Calc (NIH): 98 mg/dL (ref 0–99)
Triglycerides: 56 mg/dL (ref 0–149)
VLDL Cholesterol Cal: 11 mg/dL (ref 5–40)

## 2022-02-14 LAB — VITAMIN D 25 HYDROXY (VIT D DEFICIENCY, FRACTURES): Vit D, 25-Hydroxy: 41 ng/mL (ref 30.0–100.0)

## 2022-02-14 LAB — CARDIOVASCULAR RISK ASSESSMENT

## 2022-02-14 LAB — TSH: TSH: 2.37 u[IU]/mL (ref 0.450–4.500)

## 2022-02-22 ENCOUNTER — Other Ambulatory Visit: Payer: Self-pay | Admitting: Physician Assistant

## 2022-02-22 DIAGNOSIS — R0600 Dyspnea, unspecified: Secondary | ICD-10-CM

## 2022-03-09 ENCOUNTER — Encounter: Payer: Self-pay | Admitting: Gastroenterology

## 2022-03-10 NOTE — Progress Notes (Unsigned)
Linda Acevedo       [place] [address]             [phone]                  PCP is Marge Duncans, PA-C Referring Provider is Cox, Elnita Maxwell, MD  Chief Complaint: Ascending thoracic aortic aneurysm   HPI: This is a 66 year old female with a past medical history of hypertension, morbid obesity, OSA, arthritis, anxiety, DJD, and GERD who was found to have an ascending thoracic aortic aneurysm. She was last seen by Nicholes Rough PA-C 03/14/2021 and it was stable. Her daughter is with the patient and assisted in translating. Patient denies chest pain, pressure or tightness or shortness of breath. She has had LE edema and is on Lasix. She does elevate legs as much as possible. She does not like wearing compression stockings.  Past Medical History:  Diagnosis Date   Anxiety    Arthritis    Degenerative disc disease    Depression    GERD (gastroesophageal reflux disease)    Headache 10/08/2018   Hypertension    Morbid obesity (HCC)    Sleep apnea    uses a c-pap  Cataract on right  Past Surgical History:  Procedure Laterality Date   ABLATION SAPHENOUS VEIN W/ RFA     Bil   COLONOSCOPY     JOINT REPLACEMENT     knee/right    Family History  Problem Relation Age of Onset   Diabetes Mother    Cancer Father     Social History Social History   Tobacco Use   Smoking status: Never   Smokeless tobacco: Never  Vaping Use   Vaping Use: Never used  Substance Use Topics   Alcohol use: Never   Drug use: No    Current Outpatient Medications  Medication Sig Dispense Refill   albuterol (VENTOLIN HFA) 108 (90 Base) MCG/ACT inhaler TAKE 2 PUFFS BY MOUTH EVERY 6 HOURS AS NEEDED FOR WHEEZE OR SHORTNESS OF BREATH 8.5 each 1   clotrimazole-betamethasone (LOTRISONE) cream APPLY SMALL AMOUNT TO AFFECTED AREA 2 TIMES A DAY AS NEEDED 45 g 2   cyclobenzaprine (FLEXERIL) 10 MG tablet Take by mouth.     DULoxetine (CYMBALTA) 60 MG capsule TAKE 1 CAPSULE BY MOUTH EVERY  DAY 90 capsule 2   fluticasone (FLONASE) 50 MCG/ACT nasal spray SPRAY 2 SPRAYS INTO EACH NOSTRIL EVERY DAY 48 mL 1   furosemide (LASIX) 20 MG tablet TAKE 1 TABLET BY MOUTH EVERY DAY AS DIRECTED 90 tablet 1   lidocaine (LIDODERM) 5 % SMARTSIG:Patch(s) Topical     meloxicam (MOBIC) 15 MG tablet TAKE 1 TABLET (15 MG TOTAL) AT BEDTIME 90 tablet 1   omeprazole (PRILOSEC) 20 MG capsule TAKE 1 CAPSULE BY MOUTH EVERY DAY 90 capsule 0   oxyCODONE-acetaminophen (PERCOCET) 7.5-325 MG tablet Take by mouth.     pravastatin (PRAVACHOL) 40 MG tablet TAKE 1 TABLET BY MOUTH EVERY DAY 90 tablet 1   VITAMIN D PO Take by mouth.      Allergies  Allergen Reactions   Gabapentin Swelling and Anaphylaxis    Face swelling    Review of Systems: Cardiac Review of Systems: Y or N  Chest Pain [ N ] Resting SOB Linda Acevedo  ] Exertional SOB [ N ]  Lower Extremity Edema [ Y ] Syncope [  N] Presyncope [ N ]  General Review of Systems: [Y] = yes [ N]=no  Constitional: nausea Linda Acevedo ];   Eye : Amaurosis fugax[ N];  Resp: cough Linda Acevedo ]; wheezing[N ]; hemoptysis'[ ]'$ N;  GI: vomiting[N ]; melena[ N]; hematochezia [ N];  EX:[id number][ N]; Heme/Lymph: anemia[ N];  Neuro: TIA[ N];stroke[N ];  seizures[N ];  Endocrine: diabetes[N ];   Vital Signs: Vitals:   03/13/22 1357  BP: 139/87  Pulse: 68  Resp: 20  SpO2: 95%     Physical Exam: CV-RRR, no murmur Neck-No JVD, no carotid bruit appreciated Pulmonary-Clear to auscultation bilaterally Abdomen-Soft, obese, non tender, bowel sounds present Extremities-LE edema Neurologic-Grossly intact without focal deficit   Diagnostic Tests:   CLINICAL DATA:  Follow-up of aneurysmal dilatation of the ascending thoracic aorta.   EXAM: MRA CHEST WITH OR WITHOUT CONTRAST   TECHNIQUE: Angiographic images of the chest were obtained using MRA technique with intravenous contrast.   CONTRAST:  89m MULTIHANCE GADOBENATE DIMEGLUMINE 529 MG/ML IV SOLN   COMPARISON:  03/05/2021    FINDINGS: The study is impaired by respiratory motion artifact.   VASCULAR   Aorta: The aortic root is difficult to measure accurately but demonstrates normal and stable appearance with estimated caliber of approximately 3.9 cm. The ascending thoracic aorta again demonstrates mild dilatation measuring up to approximately 4 cm in greatest diameter. The proximal arch measures 3.6 cm and the distal arch 2.8 cm. The descending thoracic aorta measures 2.5 cm. No evidence of aortic dissection. Visualized proximal great vessels demonstrate tortuosity and normal patency.   Heart: Stable to slightly more prominent cardiac enlargement. No pericardial fluid identified.   Pulmonary Arteries: Central pulmonary arteries are normal in caliber.   NON-VASCULAR   Visualized mediastinum and hilar regions demonstrate no lymphadenopathy or masses. Visualized lungs demonstrate no focal abnormalities by MRI. No pleural fluid identified. Visualized upper abdomen and bony structures are unremarkable.   IMPRESSION: Stable mild aneurysmal dilatation of the ascending thoracic aorta measuring up to approximately 4 cm in diameter. Recommend annual imaging followup by CTA or MRA. This recommendation follows 2010 ACCF/AHA/AATS/ACR/ASA/SCA/SCAI/SIR/STS/SVM Guidelines for the Diagnosis and Management of Patients with Thoracic Aortic Disease. Circulation. 2010; 121:: W102-V253 Aortic aneurysm NOS (ICD10-I71.9)     Electronically Signed   By: GAletta EdouardM.D.   On: 02/02/2022 15:40    Impression and Plan: We reviewed the results of the CT and discussed the findings. She does not need surgery at this time. We discussed the risk modifications for those with an ascending thoracic aortic aneurysm. I did discuss with her daughter that BP is slowly increasing. Also, daughter states they have a BP machine so she will keep track of BP. She does have a medical doctor and will follow up accordingly. She will need  continued surveillance with another MRA chest in one year.   DNani Skillern PA-C [facility] ([phone]

## 2022-03-13 ENCOUNTER — Ambulatory Visit (INDEPENDENT_AMBULATORY_CARE_PROVIDER_SITE_OTHER): Payer: 59 | Admitting: Physician Assistant

## 2022-03-13 DIAGNOSIS — I7121 Aneurysm of the ascending aorta, without rupture: Secondary | ICD-10-CM

## 2022-03-13 DIAGNOSIS — I712 Thoracic aortic aneurysm, without rupture, unspecified: Secondary | ICD-10-CM | POA: Insufficient documentation

## 2022-03-13 NOTE — Patient Instructions (Addendum)
  Risk Modification in those with ascending thoracic aortic aneurysm:  Continue good control of blood pressure (prefer SBP 130/80 or less)-low salt/heart healthy diet  2. Avoid fluoroquinolone antibiotics (I.e Ciprofloxacin, Avelox, Levofloxacin, Ofloxacin)  3.  Use of statin (to decrease cardiovascular risk)-on Pravachol  4.  Exercise and activity limitations is individualized, but in general, contact sports are to be  avoided and one should avoid heavy lifting (defined as half of ideal body weight) and exercises involving sustained Valsalva maneuver.  5. Counseling for those suspected of having genetically mediated disease. First-degree relatives of those with TAA disease should be screened as well as those who have a connective tissue disease (I.e with Marfan syndrome, Ehlers-Danlos syndrome, and Loeys-Dietz syndrome) or a  bicuspid aortic valve,have an increased risk for complications related to TAA  6.She does not have smoking history

## 2022-04-20 ENCOUNTER — Other Ambulatory Visit: Payer: Self-pay | Admitting: Physician Assistant

## 2022-04-20 ENCOUNTER — Other Ambulatory Visit: Payer: Self-pay

## 2022-04-20 MED ORDER — LIDOCAINE 5 % EX PTCH: MEDICATED_PATCH | CUTANEOUS | 1 refills | Status: DC

## 2022-05-01 ENCOUNTER — Encounter: Payer: Self-pay | Admitting: Gastroenterology

## 2022-05-01 ENCOUNTER — Other Ambulatory Visit: Payer: Self-pay

## 2022-05-01 DIAGNOSIS — M5136 Other intervertebral disc degeneration, lumbar region: Secondary | ICD-10-CM

## 2022-05-01 MED ORDER — LIDOCAINE 5 % EX PTCH: MEDICATED_PATCH | CUTANEOUS | 1 refills | Status: DC

## 2022-06-19 ENCOUNTER — Ambulatory Visit (AMBULATORY_SURGERY_CENTER): Payer: Self-pay

## 2022-06-19 VITALS — Ht 70.0 in | Wt 281.6 lb

## 2022-06-19 DIAGNOSIS — Z86010 Personal history of colonic polyps: Secondary | ICD-10-CM

## 2022-06-19 MED ORDER — NA SULFATE-K SULFATE-MG SULF 17.5-3.13-1.6 GM/177ML PO SOLN: 1.0000 | Freq: Once | ORAL | 0 refills | Status: AC

## 2022-06-19 NOTE — Progress Notes (Signed)

## 2022-06-28 ENCOUNTER — Encounter: Payer: Self-pay | Admitting: Gastroenterology

## 2022-07-10 ENCOUNTER — Other Ambulatory Visit: Payer: Self-pay | Admitting: Gastroenterology

## 2022-07-10 ENCOUNTER — Ambulatory Visit (AMBULATORY_SURGERY_CENTER): Payer: 59 | Admitting: Gastroenterology

## 2022-07-10 ENCOUNTER — Encounter: Payer: Self-pay | Admitting: Gastroenterology

## 2022-07-10 VITALS — BP 118/74 | HR 77 | Temp 97.3°F | Resp 12 | Ht 63.0 in | Wt 274.8 lb

## 2022-07-10 DIAGNOSIS — D125 Benign neoplasm of sigmoid colon: Secondary | ICD-10-CM

## 2022-07-10 DIAGNOSIS — Z86010 Personal history of colonic polyps: Secondary | ICD-10-CM | POA: Diagnosis not present

## 2022-07-10 DIAGNOSIS — D123 Benign neoplasm of transverse colon: Secondary | ICD-10-CM | POA: Diagnosis not present

## 2022-07-10 DIAGNOSIS — Z09 Encounter for follow-up examination after completed treatment for conditions other than malignant neoplasm: Secondary | ICD-10-CM

## 2022-07-10 DIAGNOSIS — D122 Benign neoplasm of ascending colon: Secondary | ICD-10-CM

## 2022-07-10 MED ORDER — SODIUM CHLORIDE 0.9 % IV SOLN: 500.0000 mL | INTRAVENOUS | Status: DC

## 2022-07-10 NOTE — Progress Notes (Signed)
Pt's states no medical or surgical changes since previsit or office visit. 

## 2022-07-10 NOTE — Op Note (Signed)
[facility] Patient Name: Linda Acevedo Procedure Date: 07/10/2022 10:58 AM MRN: [id number] Endoscopist: Jackquline Denmark , MD, [id number] Age: 67 Referring MD:  Date of Birth: 1955-05-25 Gender: Female Account #: [id number] Procedure:                Colonoscopy Indications:              High risk colon cancer surveillance: Personal                            history of colonic polyps Medicines:                Monitored Anesthesia Care Procedure:                Pre-Anesthesia Assessment:                           - Prior to the procedure, a History and Physical                            was performed, and patient medications and                            allergies were reviewed. The patient's tolerance of                            previous anesthesia was also reviewed. The risks                            and benefits of the procedure and the sedation                            options and risks were discussed with the patient.                            All questions were answered, and informed consent                            was obtained. Prior Anticoagulants: The patient has                            taken no anticoagulant or antiplatelet agents. ASA                            Grade Assessment: II - A patient with mild systemic                            disease. After reviewing the risks and benefits,                            the patient was deemed in satisfactory condition to                            undergo the procedure.  After obtaining informed consent, the colonoscope                            was passed under direct vision. Throughout the                            procedure, the patient's blood pressure, pulse, and                            oxygen saturations were monitored continuously. The                            Olympus [id number]-H190DL [id number]) Colonoscope was                            introduced through the anus and  advanced to the the                            cecum, identified by appendiceal orifice and                            ileocecal valve. The colonoscopy was performed                            without difficulty. The patient tolerated the                            procedure well. The quality of the bowel                            preparation was good (given 2-day prep) the                            ileocecal valve, appendiceal orifice, and rectum                            were photographed. Scope In: 11:05:32 AM Scope Out: 11:27:26 AM Scope Withdrawal Time: 0 hours 16 minutes 18 seconds  Total Procedure Duration: 0 hours 21 minutes 54 seconds  Findings:                 Five sessile polyps were found in the mid sigmoid                            colon, proximal transverse colon, proximal                            ascending colon (2) and mid ascending colon. The                            polyps were 3 to 4 mm in size. These polyps were                            removed with a cold snare. Resection and retrieval  were complete. Few hyperplastic appearing polyps,                            confirmed by NBI were visualized in the distal                            sigmoid colon and in the rectum (not removed                            intentionally)                           A few medium-mouthed diverticula were found in the                            sigmoid colon.                           Non-bleeding internal hemorrhoids were found during                            retroflexion. The hemorrhoids were small and Grade                            I (internal hemorrhoids that do not prolapse).                           The exam was otherwise without abnormality on                            direct and retroflexion views. Complications:            No immediate complications. Estimated Blood Loss:     Estimated blood loss: none. Impression:               - Five 3  to 4 mm polyps in the mid sigmoid colon,                            in the proximal transverse colon, in the proximal                            ascending colon and in the mid ascending colon,                            removed with a cold snare. Resected and retrieved.                           - Mild sigmoid diverticulosis.                           - Non-bleeding internal hemorrhoids.                           - The examination was otherwise normal on direct  and retroflexion views. Recommendation:           - Patient has a contact number available for                            emergencies. The signs and symptoms of potential                            delayed complications were discussed with the                            patient. Return to normal activities tomorrow.                            Written discharge instructions were provided to the                            patient.                           - High fiber diet.                           - Continue present medications.                           - Await pathology results.                           - Repeat colonoscopy for surveillance based on                            pathology results.                           - The findings and recommendations were discussed                            with the patient's family. Jackquline Denmark, MD 07/10/2022 11:31:48 AM This report has been signed electronically.

## 2022-07-10 NOTE — Progress Notes (Signed)
[facility] History and Physical   Primary Care Physician:  Linda Duncans, PA-C   Reason for Procedure:  History of colon polyps-Limited preparation  Plan:    Colonoscopy with 2-day prep     HPI: Linda Acevedo is a 67 y.o. female    Past Medical History:  Diagnosis Date   Anxiety    Arthritis    Cataract    Degenerative disc disease    Depression    GERD (gastroesophageal reflux disease)    Headache 10/08/2018   Hyperlipidemia    Hypertension    Morbid obesity ([place])    Sleep apnea    uses a c-pap    Past Surgical History:  Procedure Laterality Date   ABLATION SAPHENOUS VEIN W/ RFA     Bil   COLONOSCOPY     JOINT REPLACEMENT     knee/right    Prior to Admission medications   Medication Sig Start Date End Date Taking? Authorizing Provider  albuterol (VENTOLIN HFA) 108 (90 Base) MCG/ACT inhaler TAKE 2 PUFFS BY MOUTH EVERY 6 HOURS AS NEEDED FOR WHEEZE OR SHORTNESS OF BREATH 02/22/22   Linda Duncans, PA-C  clotrimazole-betamethasone (LOTRISONE) cream APPLY SMALL AMOUNT TO AFFECTED AREA 2 TIMES A DAY AS NEEDED Patient taking differently: Apply 1 Application topically as needed. 01/24/22   Linda Duncans, PA-C  cyclobenzaprine (FLEXERIL) 10 MG tablet Take by mouth. 11/21/19   [provider]  DULoxetine (CYMBALTA) 60 MG capsule TAKE 1 CAPSULE BY MOUTH EVERY DAY 11/23/21   Acevedo, Kirsten, MD  fluticasone [facility]) 50 MCG/ACT nasal spray SPRAY 2 SPRAYS INTO EACH NOSTRIL EVERY DAY 08/30/20   Linda Duncans, PA-C  furosemide (LASIX) 20 MG tablet TAKE 1 TABLET BY MOUTH EVERY DAY AS DIRECTED 09/29/21   Linda Duncans, PA-C  lidocaine (LIDODERM) 5 % Remove & Discard patch within 12 hours or as directed by MD 05/01/22   Linda Duncans, PA-C  meloxicam (MOBIC) 15 MG tablet TAKE 1 TABLET (15 MG TOTAL) AT BEDTIME 11/23/21   Acevedo, Linda Maxwell, MD  omeprazole (PRILOSEC) 20 MG capsule TAKE 1 CAPSULE BY MOUTH EVERY DAY 05/30/21   Linda Duncans, PA-C  oxyCODONE-acetaminophen (PERCOCET) 7.5-325  MG tablet Take by mouth. 11/20/19   [provider]  pravastatin (PRAVACHOL) 40 MG tablet TAKE 1 TABLET BY MOUTH EVERY DAY 11/23/21   Acevedo, Linda Maxwell, MD  VITAMIN D PO Take by mouth.    [provider]    Current Outpatient Medications  Medication Sig Dispense Refill   albuterol (VENTOLIN HFA) 108 (90 Base) MCG/ACT inhaler TAKE 2 PUFFS BY MOUTH EVERY 6 HOURS AS NEEDED FOR WHEEZE OR SHORTNESS OF BREATH 8.5 each 1   clotrimazole-betamethasone (LOTRISONE) cream APPLY SMALL AMOUNT TO AFFECTED AREA 2 TIMES A DAY AS NEEDED (Patient taking differently: Apply 1 Application topically as needed.) 45 g 2   cyclobenzaprine (FLEXERIL) 10 MG tablet Take by mouth.     DULoxetine (CYMBALTA) 60 MG capsule TAKE 1 CAPSULE BY MOUTH EVERY DAY 90 capsule 2   fluticasone (FLONASE) 50 MCG/ACT nasal spray SPRAY 2 SPRAYS INTO EACH NOSTRIL EVERY DAY 48 mL 1   furosemide (LASIX) 20 MG tablet TAKE 1 TABLET BY MOUTH EVERY DAY AS DIRECTED 90 tablet 1   lidocaine (LIDODERM) 5 % Remove & Discard patch within 12 hours or as directed by MD 30 patch 1   meloxicam (MOBIC) 15 MG tablet TAKE 1 TABLET (15 MG TOTAL) AT BEDTIME 90 tablet 1   omeprazole (PRILOSEC) 20 MG capsule TAKE 1 CAPSULE  BY MOUTH EVERY DAY 90 capsule 0   oxyCODONE-acetaminophen (PERCOCET) 7.5-325 MG tablet Take by mouth.     pravastatin (PRAVACHOL) 40 MG tablet TAKE 1 TABLET BY MOUTH EVERY DAY 90 tablet 1   VITAMIN D PO Take by mouth.     Current Facility-Administered Medications  Medication Dose Route Frequency Provider Last Rate Last Admin   0.9 %  sodium chloride infusion  500 mL Intravenous Continuous Linda Denmark, MD        Allergies as of 07/10/2022 - Review Complete 07/10/2022  Allergen Reaction Noted   Gabapentin Swelling and Anaphylaxis 09/14/2010    Family History  Problem Relation Age of Onset   Esophageal cancer Father        died at 55   Cancer Father    Colon cancer Sister 76       died at 24   Colon polyps Neg Hx    Rectal  cancer Neg Hx    Stomach cancer Neg Hx     Social History   Socioeconomic History   Marital status: Divorced    Spouse name: Not on file   Number of children: 2   Years of education: Not on file   Highest education level: Not on file  Occupational History   Occupation: homemaker  Tobacco Use   Smoking status: Never   Smokeless tobacco: Never  Vaping Use   Vaping Use: Never used  Substance and Sexual Activity   Alcohol use: Never   Drug use: No   Sexual activity: Never    Partners: Male  Other Topics Concern   Not on file  Social History Narrative   Lives with daughter Linda Acevedo   Right handed    2 cups of coffee daily    Social Determinants of Health   Financial Resource Strain: Not on file  Food Insecurity: Not on file  Transportation Needs: Not on file  Physical Activity: Not on file  Stress: Not on file  Social Connections: Not on file  Intimate Partner Violence: Not on file    Review of Systems: Positive for none All other review of systems negative except as mentioned in the HPI.  Physical Exam: Vital signs in last 24 hours: @VSRANGES$ @   General:   Alert,  Well-developed, well-nourished, pleasant and cooperative in NAD Lungs:  Clear throughout to auscultation.   Heart:  Regular rate and rhythm; no murmurs, clicks, rubs,  or gallops. Abdomen:  Soft, nontender and nondistended. Normal bowel sounds.   Neuro/Psych:  Alert and cooperative. Normal mood and affect. A and O x 3    No significant changes were identified.  The patient continues to be an appropriate candidate for the planned procedure and anesthesia.   Linda Austria, MD. [facility] 07/10/2022 10:45 AM@

## 2022-07-10 NOTE — Progress Notes (Signed)
Report to PACU, RN, vss, BBS= Clear.  

## 2022-07-10 NOTE — Progress Notes (Signed)
All question ask and answered via interpreter, pt daughter Arturo Morton.

## 2022-07-10 NOTE — Patient Instructions (Addendum)
-  -Se proporciona folleto sobre plipos, hemorroides y Horticulturist, commercial. Handout on polyps, hemorrhoids, and high fiber diet provided   -esperar resultados de patologa.  await pathology results -Se recomienda repetir la colonoscopia para vigilancia. Fecha por determinar cuando el resultado de la patologa est disponible.  repeat colonoscopy for surveillance recommended. Date to be determined when pathology result become available   - Continuar con la medicacin actual.  Continue present medications      USTED TUVO UN PROCEDIMIENTO ENDOSCPICO HOY EN EL [facility]:   Lea el informe del procedimiento que se le entreg para cualquier pregunta especfica sobre lo que se Primary school teacher.  Si el informe del examen no responde a sus preguntas, por favor llame a su gastroenterlogo para aclararlo.  Si usted solicit que no se le den [employer] de lo que se Estate manager/land agent en su procedimiento al [employer] va a cuidar, entonces el informe del procedimiento se ha incluido en un sobre sellado para que usted lo revise despus cuando le sea ms conveniente.   LO QUE PUEDE ESPERAR: Algunas sensaciones de hinchazn en el abdomen.  Puede tener ms gases de lo normal.  El caminar puede ayudarle a eliminar el aire que se le puso en el tracto gastrointestinal durante el procedimiento y reducir la hinchazn.  Si le hicieron una endoscopia inferior (como una colonoscopia o una sigmoidoscopia flexible), podra notar manchas de sangre en las heces fecales o en el papel higinico.  Si se someti a una preparacin intestinal para su procedimiento, es posible que no tenga una evacuacin intestinal normal durante [employer].   Tenga en cuenta:  Es posible que note un poco de irritacin y congestin en la nariz o algn drenaje.  Esto es debido al oxgeno [employer] durante su procedimiento.  No hay que preocuparse y esto debe desaparecer ms o Scientist, research (medical).   SNTOMAS PARA REPORTAR  INMEDIATAMENTE:  Despus de una endoscopia inferior (colonoscopia o sigmoidoscopia flexible):  Cantidades excesivas de sangre en las heces fecales  Sensibilidad significativa o empeoramiento de los dolores abdominales   Hinchazn aguda del abdomen que antes no tena   Fiebre de 100F o ms   Para asuntos urgentes o de Freight forwarder, puede comunicarse con un gastroenterlogo a cualquier hora llamando al [phone].  DIETA:  Recomendamos una comida pequea al principio, pero luego puede continuar con su dieta normal.  Tome muchos lquidos, Teacher, adult education las bebidas alcohlicas durante 24 horas.    ACTIVIDAD:  Debe planear tomarse las cosas con calma por el resto del da y no debe CONDUCIR ni usar maquinaria pesada Programmer, applications (debido a los medicamentos de sedacin utilizados durante el examen).     SEGUIMIENTO: Nuestro personal llamar al nmero que aparece en su historial al siguiente da hbil de su procedimiento para ver cmo se siente y para responder cualquier pregunta o inquietud que pueda tener con respecto a la informacin que se le dio despus del procedimiento. Si no podemos contactarle, le dejaremos un mensaje.  Sin embargo, si se siente bien y no tiene Paediatric nurse, no es necesario que nos devuelva la llamada.  Asumiremos que ha regresado a sus actividades diarias normales sin incidentes. Si se le tomaron algunas biopsias, le contactaremos por telfono o por carta en las prximas 3 semanas.  Si no ha sabido [employer] biopsias en el transcurso de 3 semanas, por favor llmenos al [phone].   FIRMAS/CONFIDENCIALIDAD: Waldron Session y/o el acompaante  que le cuide han firmado documentos que se ingresarn en su historial mdico electrnico.  Estas firmas atestiguan el hecho de que la informacin anterior    YOU HAD AN ENDOSCOPIC PROCEDURE TODAY AT [facility]:   Refer to the procedure report that was given to you for any specific questions about what was found  during the examination.  If the procedure report does not answer your questions, please call your gastroenterologist to clarify.  If you requested that your care partner not be given the details of your procedure findings, then the procedure report has been included in a sealed envelope for you to review at your convenience later.  YOU SHOULD EXPECT: Some feelings of bloating in the abdomen. Passage of more gas than usual.  Walking can help get rid of the air that was put into your GI tract during the procedure and reduce the bloating. If you had a lower endoscopy (such as a colonoscopy or flexible sigmoidoscopy) you may notice spotting of blood in your stool or on the toilet paper. If you underwent a bowel prep for your procedure, you may not have a normal bowel movement for a few days.  Please Note:  You might notice some irritation and congestion in your nose or some drainage.  This is from the oxygen used during your procedure.  There is no need for concern and it should clear up in a day or so.  SYMPTOMS TO REPORT IMMEDIATELY:  Following lower endoscopy (colonoscopy or flexible sigmoidoscopy):  Excessive amounts of blood in the stool  Significant tenderness or worsening of abdominal pains  Swelling of the abdomen that is new, acute  Fever of 100F or higher   For urgent or emergent issues, a gastroenterologist can be reached at any hour by calling [phone]. Do not use MyChart messaging for urgent concerns.    DIET:  We do recommend a small meal at first, but then you may proceed to your regular diet.  Drink plenty of fluids but you should avoid alcoholic beverages for 24 hours.  ACTIVITY:  You should plan to take it easy for the rest of today and you should NOT DRIVE or use heavy machinery until tomorrow (because of the sedation medicines used during the test).    FOLLOW UP: Our staff will call the number listed on your records the next business day following your procedure.  We  will call around 7:15- 8:00 am to check on you and address any questions or concerns that you may have regarding the information given to you following your procedure. If we do not reach you, we will leave a message.     If any biopsies were taken you will be contacted by phone or by letter within the next 1-3 weeks.  Please call us at [phone] if you have not heard about the biopsies in 3 weeks.    SIGNATURES/CONFIDENTIALITY: You and/or your care partner have signed paperwork which will be entered into your electronic medical record.  These signatures attest to the fact that that the information above on your After Visit Summary has been reviewed and is understood.  Full responsibility of the confidentiality of this discharge information lies with you and/or your care-partner.

## 2022-07-10 NOTE — Progress Notes (Signed)
Called to room to assist during endoscopic procedure.  Patient ID and intended procedure confirmed with present staff. Received instructions for my participation in the procedure from the performing physician.  

## 2022-07-11 ENCOUNTER — Telehealth: Payer: Self-pay | Admitting: *Deleted

## 2022-07-11 NOTE — Telephone Encounter (Signed)
  Follow up Call-     07/10/2022   10:46 AM  Call back number  Post procedure Call Back phone  # [phone]  Permission to leave phone message Yes     Patient questions:  Do you have a fever, pain , or abdominal swelling? No. Pain Score  0 *  Have you tolerated food without any problems? Yes.    Have you been able to return to your normal activities? Yes.    Do you have any questions about your discharge instructions: Diet   No. Medications  No. Follow up visit  No.  Do you have questions or concerns about your Care? No.  Actions: * If pain score is 4 or above: No action needed, pain <4.

## 2022-07-15 ENCOUNTER — Encounter: Payer: Self-pay | Admitting: Gastroenterology

## 2022-07-17 ENCOUNTER — Other Ambulatory Visit: Payer: Self-pay | Admitting: Family Medicine

## 2022-07-18 ENCOUNTER — Telehealth: Payer: Self-pay

## 2022-07-18 NOTE — Telephone Encounter (Signed)
Contacted Linda Acevedo to schedule their annual wellness visit. Appointment made for 07/27/22.  Norton Blizzard, [place] ([facility])  [facility] [phone]

## 2022-07-21 ENCOUNTER — Other Ambulatory Visit: Payer: Self-pay | Admitting: Physician Assistant

## 2022-07-21 DIAGNOSIS — M5136 Other intervertebral disc degeneration, lumbar region: Secondary | ICD-10-CM

## 2022-07-27 ENCOUNTER — Ambulatory Visit (INDEPENDENT_AMBULATORY_CARE_PROVIDER_SITE_OTHER): Payer: 59

## 2022-07-27 DIAGNOSIS — Z9189 Other specified personal risk factors, not elsewhere classified: Secondary | ICD-10-CM

## 2022-07-27 DIAGNOSIS — R928 Other abnormal and inconclusive findings on diagnostic imaging of breast: Secondary | ICD-10-CM | POA: Diagnosis not present

## 2022-07-27 DIAGNOSIS — Z1231 Encounter for screening mammogram for malignant neoplasm of breast: Secondary | ICD-10-CM

## 2022-07-27 DIAGNOSIS — N959 Unspecified menopausal and perimenopausal disorder: Secondary | ICD-10-CM

## 2022-07-27 DIAGNOSIS — Z0001 Encounter for general adult medical examination with abnormal findings: Secondary | ICD-10-CM

## 2022-07-27 DIAGNOSIS — Z0000 Encounter for general adult medical examination without abnormal findings: Secondary | ICD-10-CM

## 2022-07-27 NOTE — Progress Notes (Signed)
Subjective:   Linda Acevedo is a 67 y.o. female who presents for Medicare Annual (Subsequent) preventive examination.  I connected with  Farrell Storment Cordero on 07/27/22 by a audio enabled telemedicine application and verified that I am speaking with the correct person using two identifiers.  Patient's daughter was also on the phone assisting with the visit.  Patient Location: Home  Provider Location: Office/Clinic  I discussed the limitations of evaluation and management by telemedicine. The patient expressed understanding and agreed to proceed.  Cardiac Risk Factors include: advanced age (>63mn, >>56women);obesity (BMI >30kg/m2)     Objective:    Today's Vitals   07/27/22 1221  PainSc: 2    There is no height or weight on file to calculate BMI.   Current Medications (verified) Outpatient Encounter Medications as of 07/27/2022  Medication Sig   albuterol (VENTOLIN HFA) 108 (90 Base) MCG/ACT inhaler TAKE 2 PUFFS BY MOUTH EVERY 6 HOURS AS NEEDED FOR WHEEZE OR SHORTNESS OF BREATH   clotrimazole-betamethasone (LOTRISONE) cream Apply 1 Application topically as needed.   cyclobenzaprine (FLEXERIL) 10 MG tablet Take by mouth.   DULoxetine (CYMBALTA) 60 MG capsule TAKE 1 CAPSULE BY MOUTH EVERY DAY   fluticasone (FLONASE) 50 MCG/ACT nasal spray SPRAY 2 SPRAYS INTO EACH NOSTRIL EVERY DAY   furosemide (LASIX) 20 MG tablet TAKE 1 TABLET BY MOUTH EVERY DAY AS DIRECTED   lidocaine (LIDODERM) 5 % REMOVE & DISCARD PATCH WITHIN 12 HOURS OR AS DIRECTED BY MD   meloxicam (MOBIC) 15 MG tablet TAKE 1 TABLET (15 MG TOTAL) AT BEDTIME   omeprazole (PRILOSEC) 20 MG capsule TAKE 1 CAPSULE BY MOUTH EVERY DAY   pravastatin (PRAVACHOL) 40 MG tablet TAKE 1 TABLET BY MOUTH EVERY DAY   VITAMIN D PO Take by mouth.   [DISCONTINUED] oxyCODONE-acetaminophen (PERCOCET) 7.5-325 MG tablet Take by mouth.   No facility-administered encounter medications on file as of 07/27/2022.    Allergies  (verified) Gabapentin   History: Past Medical History:  Diagnosis Date   Anxiety    Arthritis    Cataract    Degenerative disc disease    Depression    GERD (gastroesophageal reflux disease)    Headache 10/08/2018   Hyperlipidemia    Hypertension    Morbid obesity (HCC)    Sleep apnea    uses a c-pap   Past Surgical History:  Procedure Laterality Date   ABLATION SAPHENOUS VEIN W/ RFA     Bil   COLONOSCOPY     JOINT REPLACEMENT     knee/right   Family History  Problem Relation Age of Onset   Esophageal cancer Father        died at 72  Cancer Father    Colon cancer Sister 614      died at 68  Colon polyps Neg Hx    Rectal cancer Neg Hx    Stomach cancer Neg Hx    Social History   Socioeconomic History   Marital status: Divorced    Spouse name: Not on file   Number of children: 2   Years of education: Not on file   Highest education level: Not on file  Occupational History   Occupation: homemaker  Tobacco Use   Smoking status: Never   Smokeless tobacco: Never  Vaping Use   Vaping Use: Never used  Substance and Sexual Activity   Alcohol use: Yes    Comment: very rare that she has a drink   Drug use:  No   Sexual activity: Never    Partners: Male  Other Topics Concern   Not on file  Social History Narrative   Lives with daughter [name]   Right handed    2 cups of coffee daily    Social Determinants of Health   Financial Resource Strain: Low Risk  (07/27/2022)   Overall Financial Resource Strain (CARDIA)    Difficulty of Paying Living Expenses: Not very hard  Food Insecurity: No Food Insecurity (07/27/2022)   Hunger Vital Sign    Worried About Running Out of Food in the Last Year: Never true    Ran Out of Food in the Last Year: Never true  Transportation Needs: No Transportation Needs (07/27/2022)   PRAPARE - Hydrologist (Medical): No    Lack of Transportation (Non-Medical): No  Physical Activity: Inactive (07/27/2022)    Exercise Vital Sign    Days of Exercise per Week: 0 days    Minutes of Exercise per Session: 0 min  Stress: No Stress Concern Present (07/27/2022)   [place]    Feeling of Stress : Not at all  Social Connections: Not on file    Tobacco Counseling Counseling given: Not Answered   Clinical Intake:  Pre-visit preparation completed: Yes Pain : 0-10 Pain Score: 2  Pain Type: Acute pain Pain Location: Face Pain Descriptors / Indicators: Aching Pain Onset: In the past 7 days Pain Frequency: Intermittent   BMI - recorded: 48.69 Nutritional Status: BMI > 30  Obese Nutritional Risks: None Diabetes: No How often do you need to have someone help you when you read instructions, pamphlets, or other written materials from your doctor or pharmacy?: 3 - Sometimes       Activities of Daily Living    07/27/2022   11:27 AM  In your present state of health, do you have any difficulty performing the following activities:  Hearing? 1  Comment corrected with hearing aids  Vision? 1  Difficulty concentrating or making decisions? 1  Walking or climbing stairs? 0  Dressing or bathing? 0  Doing errands, shopping? 0  Preparing Food and eating ? N  Using the Toilet? N  In the past six months, have you accidently leaked urine? N  Do you have problems with loss of bowel control? N  Managing your Medications? N  Managing your Finances? N  Housekeeping or managing your Housekeeping? N    Patient Care Team: Marge Duncans, Hershal Coria as PCP - General (Physician Assistant) Jackquline Denmark, MD as Consulting Physician (Gastroenterology)     Assessment:   This is a routine wellness examination for [facility].  Hearing/Vision screen No results found.  Dietary issues and exercise activities discussed: Current Exercise Habits: The patient does not participate in regular exercise at present, Exercise limited by: None identified   Goals  Addressed             This Visit's Progress    Prevent falls       Patient will remain free of falls. Patient will demonstrate a safe environment free from potential hazards.       Depression Screen    07/27/2022   12:26 PM 02/13/2022    9:07 AM 08/04/2020    9:13 AM  PHQ 2/9 Scores  PHQ - 2 Score 0 0 0    Fall Risk    07/27/2022   12:29 PM 02/13/2022    9:07 AM 08/04/2020  9:13 AM  Fall Risk   Falls in the past year? 1 0 0  Number falls in past yr: 0 0 0  Injury with Fall? 1 0 0  Comment tripped and fell causing multiple bruises on her face    Risk for fall due to : History of fall(s) No Fall Risks No Fall Risks  Follow up Falls evaluation completed;Education provided Falls evaluation completed     [place]:  Any stairs in or around the home? Yes  If so, are there any without handrails? No  Home free of loose throw rugs in walkways, pet beds, electrical cords, etc? Yes  Adequate lighting in your home to reduce risk of falls? Yes   ASSISTIVE DEVICES UTILIZED TO PREVENT FALLS:  Life alert? No  Use of a cane, walker or w/c? No  Grab bars in the bathroom? Yes  Shower chair or bench in shower? No  Elevated toilet seat or a handicapped toilet? No      Immunizations Immunization History  Administered Date(s) Administered   Fluad Quad(high Dose 65+) 02/07/2021   Influenza Inj Mdck Quad Pf 02/03/2020   Influenza-Unspecified 02/20/2019   PFIZER(Purple Top)SARS-COV-2 Vaccination 11/28/2019, 12/29/2019   Pneumococcal Conjugate-13 08/04/2020   Pneumococcal Polysaccharide-23 04/03/2012, 08/08/2021   Td 05/05/2013   Zoster Recombinat (Shingrix) 02/22/2021, 04/26/2021    TDAP status: Up to date  Flu Vaccine status: Declined, Education has been provided regarding the importance of this vaccine but patient still declined. Advised may receive this vaccine at local pharmacy or Health Dept. Aware to provide a copy of the vaccination  record if obtained from local pharmacy or Health Dept. Verbalized acceptance and understanding.  Pneumococcal vaccine status: Up to date  Covid-19 vaccine status: Declined, Education has been provided regarding the importance of this vaccine but patient still declined. Advised may receive this vaccine at local pharmacy or Health Dept.or vaccine clinic. Aware to provide a copy of the vaccination record if obtained from local pharmacy or Health Dept. Verbalized acceptance and understanding.  Qualifies for Shingles Vaccine? Yes   Zostavax completed No   Shingrix Completed?: Yes  Screening Tests Health Maintenance  Topic Date Due   Medicare Annual Wellness (AWV)  Never done   INFLUENZA VACCINE  08/20/2022 (Originally 12/20/2021)   MAMMOGRAM  12/14/2022   DTaP/Tdap/Td (2 - Tdap) 05/06/2023   COLONOSCOPY (Pts 45-46yr Insurance coverage will need to be confirmed)  07/10/2025   Pneumonia Vaccine 67 Years old  Completed   DEXA SCAN  Completed   Zoster Vaccines- Shingrix  Completed   HPV VACCINES  Aged Out   COVID-19 Vaccine  Discontinued   Hepatitis C Screening  Discontinued    Health Maintenance  Health Maintenance Due  Topic Date Due   Medicare Annual Wellness (AWV)  Never done    Colorectal cancer screening: Type of screening: Colonoscopy. Completed 07/10/22. Repeat every 3 years  Mammogram status: Completed 12/13/21. Repeat every year  Bone Density status: Completed 09/20/20. Results reflect: Bone density results: NORMAL. Repeat every 2 years.  Lung Cancer Screening: (Low Dose CT Chest recommended if Age 67-80years, 30 pack-year currently smoking OR have quit w/in 15years.) does not qualify.   Lung Cancer Screening Referral: N/A  Additional Screening:  Vision Screening: Recommended annual ophthalmology exams for early detection of glaucoma and other disorders of the eye. Is the patient up to date with their annual eye exam?  Yes  Who is the provider or what is the name of  the  office in which the patient attends annual eye exams? [facility]  Dental Screening: Recommended annual dental exams for proper oral hygiene  Community Resource Referral / Chronic Care Management: CRR required this visit?  No   CCM required this visit?  No      Plan:    1- Diagnostic BL Mammo & Right Ultrasound ordered for July 2- DEXA ordered 3- Patient declined flu vaccine 4- Recommended healthy diet and exercise  I have personally reviewed and noted the following in the patient's chart:   Medical and social history Use of alcohol, tobacco or illicit drugs  Current medications and supplements including opioid prescriptions. Patient is not currently taking opioid prescriptions. Functional ability and status Nutritional status Physical activity Advanced directives List of other physicians Hospitalizations, surgeries, and ER visits in previous 12 months Vitals Screenings to include cognitive, depression, and falls Referrals and appointments  In addition, I have reviewed and discussed with patient certain preventive protocols, quality metrics, and best practice recommendations. A written personalized care plan for preventive services as well as general preventive health recommendations were provided to patient.     Erie Noe, LPN   [phone]

## 2022-07-27 NOTE — Patient Instructions (Signed)
Linda Acevedo , Thank you for taking time to come for your Medicare Wellness Visit. I appreciate your ongoing commitment to your health goals. Please review the following plan we discussed and let me know if I can assist you in the future.   Screening recommendations/referrals: Colonoscopy: Due Feb 2027 Mammogram: Ordered Bone Density: Ordered Recommended yearly ophthalmology/optometry visit for glaucoma screening and checkup Recommended yearly dental visit for hygiene and checkup  Vaccinations: Influenza vaccine: Declined Pneumococcal vaccine: up-to-date Tdap vaccine: Due in December this year Shingles vaccine: Complete     Preventive Care 66 Years and Older, Female   Preventive care refers to lifestyle choices and visits with your health care provider that can promote health and wellness.  What does preventive care include? A yearly physical exam. This is also called an annual well check. Dental exams once or twice a year. Routine eye exams. Ask your health care provider how often you should have your eyes checked. Personal lifestyle choices, including: Daily care of your teeth and gums. Regular physical activity. Eating a healthy diet. Avoiding tobacco and drug use. Limiting alcohol use. Practicing safe sex. Taking low-dose aspirin every day. Taking vitamin and mineral supplements as recommended by your health care provider.  What happens during an annual well check? The services and screenings done by your health care provider during your annual well check will depend on your age, overall health, lifestyle risk factors, and family history of disease.  Counseling Your health care provider may ask you questions about your: Alcohol use. Tobacco use. Drug use. Emotional well-being. Home and relationship well-being. Sexual activity. Eating habits. History of falls. Memory and ability to understand (cognition). Work and work Statistician. Reproductive  health.  Screening You may have the following tests or measurements: Height, weight, and BMI. Blood pressure. Lipid and cholesterol levels. These may be checked every 5 years, or more frequently if you are over 20 years old. Skin check. Lung cancer screening. You may have this screening every year starting at age 66 if you have a 30-pack-year history of smoking and currently smoke or have quit within the past 15 years. Fecal occult blood test (FOBT) of the stool. You may have this test every year starting at age 73. Flexible sigmoidoscopy or colonoscopy. You may have a sigmoidoscopy every 5 years or a colonoscopy every 10 years starting at age 80. Hepatitis C blood test. Hepatitis B blood test. Sexually transmitted disease (STD) testing. Diabetes screening. This is done by checking your blood sugar (glucose) after you have not eaten for a while (fasting). You may have this done every 1-3 years. Bone density scan. This is done to screen for osteoporosis. You may have this done starting at age 2. Mammogram. This may be done every 1-2 years. Talk to your health care provider about how often you should have regular mammograms. Talk with your health care provider about your test results, treatment options, and if necessary, the need for more tests.  Vaccines Your health care provider may recommend certain vaccines, such as: Influenza vaccine. This is recommended every year. Tetanus, diphtheria, and acellular pertussis (Tdap, Td) vaccine. You may need a Td booster every 10 years. Zoster vaccine. You may need this after age 55. Pneumococcal 13-valent conjugate (PCV13) vaccine. One dose is recommended after age 83. Pneumococcal polysaccharide (PPSV23) vaccine. One dose is recommended after age 33. Talk to your health care provider about which screenings and vaccines you need and how often you need them.  This information is not  intended to replace advice given to you by your health care provider.  Make sure you discuss any questions you have with your health care provider. Document Released: 06/04/2015 Document Revised: 01/26/2016 Document Reviewed: 03/09/2015 Elsevier Interactive Patient Education  2017 [employer] Prevention in the Home  Falls can cause injuries. They can happen to people of all ages. There are many things you can do to make your home safe and to help prevent falls.  What can I do on the outside of my home? Regularly fix the edges of walkways and driveways and fix any cracks. Remove anything that might make you trip as you walk through a door, such as a raised step or threshold. Trim any bushes or trees on the path to your home. Use bright outdoor lighting. Clear any walking paths of anything that might make someone trip, such as rocks or tools. Regularly check to see if handrails are loose or broken. Make sure that both sides of any steps have handrails. Any raised decks and porches should have guardrails on the edges. Have any leaves, snow, or ice cleared regularly. Use sand or salt on walking paths during winter. Clean up any spills in your garage right away. This includes oil or grease spills.  What can I do in the bathroom? Use night lights. Install grab bars by the toilet and in the tub and shower. Do not use towel bars as grab bars. Use non-skid mats or decals in the tub or shower. If you need to sit down in the shower, use a plastic, non-slip stool. Keep the floor dry. Clean up any water that spills on the floor as soon as it happens. Remove soap buildup in the tub or shower regularly. Attach bath mats securely with double-sided non-slip rug tape. Do not have throw rugs and other things on the floor that can make you trip.  What can I do in the bedroom? Use night lights. Make sure that you have a light by your bed that is easy to reach. Do not use any sheets or blankets that are too big for your bed. They should not hang down onto the  floor. Have a firm chair that has side arms. You can use this for support while you get dressed. Do not have throw rugs and other things on the floor that can make you trip.  What can I do in the kitchen? Clean up any spills right away. Avoid walking on wet floors. Keep items that you use a lot in easy-to-reach places. If you need to reach something above you, use a strong step stool that has a grab bar. Keep electrical cords out of the way. Do not use floor polish or wax that makes floors slippery. If you must use wax, use non-skid floor wax. Do not have throw rugs and other things on the floor that can make you trip.  What can I do with my stairs? Do not leave any items on the stairs. Make sure that there are handrails on both sides of the stairs and use them. Fix handrails that are broken or loose. Make sure that handrails are as long as the stairways. Check any carpeting to make sure that it is firmly attached to the stairs. Fix any carpet that is loose or worn. Avoid having throw rugs at the top or bottom of the stairs. If you do have throw rugs, attach them to the floor with carpet tape. Make sure that you have a light  switch at the top of the stairs and the bottom of the stairs. If you do not have them, ask someone to add them for you.  What else can I do to help prevent falls? Wear shoes that: Do not have high heels. Have rubber bottoms. Are comfortable and fit you well. Are closed at the toe. Do not wear sandals. If you use a stepladder: Make sure that it is fully opened. Do not climb a closed stepladder. Make sure that both sides of the stepladder are locked into place. Ask someone to hold it for you, if possible. Clearly mark and make sure that you can see: Any grab bars or handrails. First and last steps. Where the edge of each step is. Use tools that help you move around (mobility aids) if they are needed. These include: Canes. Walkers. Scooters. Crutches. Turn on  the lights when you go into a dark area. Replace any light bulbs as soon as they burn out. Set up your furniture so you have a clear path. Avoid moving your furniture around. If any of your floors are uneven, fix them. If there are any pets around you, be aware of where they are. Review your medicines with your doctor. Some medicines can make you feel dizzy. This can increase your chance of falling. Ask your doctor what other things that you can do to help prevent falls.  This information is not intended to replace advice given to you by your health care provider. Make sure you discuss any questions you have with your health care provider. Document Released: 03/04/2009 Document Revised: 10/14/2015 Document Reviewed: 06/12/2014 Elsevier Interactive Patient Education  2017 [employer].

## 2022-08-05 ENCOUNTER — Ambulatory Visit
Admission: RE | Admit: 2022-08-05 | Discharge: 2022-08-05 | Disposition: A | Discharge status: Home / Self Care | Payer: 59 | Source: Ambulatory Visit | Attending: Emergency Medicine | Admitting: Emergency Medicine

## 2022-08-05 VITALS — BP 129/87 | HR 83 | Temp 98.4°F | Resp 17

## 2022-08-05 DIAGNOSIS — S61212A Laceration without foreign body of right middle finger without damage to nail, initial encounter: Secondary | ICD-10-CM

## 2022-08-05 MED ORDER — CEPHALEXIN 500 MG PO CAPS: 500.0000 mg | ORAL_CAPSULE | Freq: Four times a day (QID) | ORAL | 0 refills | Status: DC

## 2022-08-05 MED ORDER — MUPIROCIN 2 % EX OINT: 1.0000 | TOPICAL_OINTMENT | Freq: Two times a day (BID) | CUTANEOUS | 0 refills | Status: DC

## 2022-08-05 NOTE — ED Triage Notes (Signed)
Pt presents with laceration to right middle finger from metal flower bed yesterday; pt had tetanus December 2014

## 2022-08-05 NOTE — ED Provider Notes (Addendum)
[place]   [id number] 08/05/22 Arrival Time: [phone]   Chief Complaint  Patient presents with   Finger Injury    Entered by patient     SUBJECTIVE: History from: patient.  Linda Acevedo is a 67 y.o. female w presenting to the urgent care for complaint of right middle finger laceration that occurred yesterday.  Developed symptoms after doing some yard work.  Has not tried OTC medication without relief.  Denies similar symptoms in the past.  Tetanus immunization is up-to-date.   Denies fever, chills, fatigue,  SOB, wheezing, chest pain, nausea, changes in bowel or bladder habits.    ROS: As per HPI.  All other pertinent ROS negative.     Past Medical History:  Diagnosis Date   Anxiety    Arthritis    Cataract    Degenerative disc disease    Depression    GERD (gastroesophageal reflux disease)    Headache 10/08/2018   Hyperlipidemia    Hypertension    Morbid obesity ([place])    Sleep apnea    uses a c-pap   Past Surgical History:  Procedure Laterality Date   ABLATION SAPHENOUS VEIN W/ RFA     Bil   COLONOSCOPY     JOINT REPLACEMENT     knee/right   Allergies  Allergen Reactions   Gabapentin Swelling and Anaphylaxis    Face swelling   No current facility-administered medications on file prior to encounter.   Current Outpatient Medications on File Prior to Encounter  Medication Sig Dispense Refill   albuterol (VENTOLIN HFA) 108 (90 Base) MCG/ACT inhaler TAKE 2 PUFFS BY MOUTH EVERY 6 HOURS AS NEEDED FOR WHEEZE OR SHORTNESS OF BREATH 8.5 each 1   clotrimazole-betamethasone (LOTRISONE) cream Apply 1 Application topically as needed. 45 g 2   cyclobenzaprine (FLEXERIL) 10 MG tablet Take by mouth.     DULoxetine (CYMBALTA) 60 MG capsule TAKE 1 CAPSULE BY MOUTH EVERY DAY 90 capsule 2   fluticasone (FLONASE) 50 MCG/ACT nasal spray SPRAY 2 SPRAYS INTO EACH NOSTRIL EVERY DAY 48 mL 1   furosemide (LASIX) 20 MG tablet TAKE 1 TABLET BY MOUTH EVERY DAY AS DIRECTED  90 tablet 1   lidocaine (LIDODERM) 5 % REMOVE & DISCARD PATCH WITHIN 12 HOURS OR AS DIRECTED BY MD 30 patch 1   meloxicam (MOBIC) 15 MG tablet TAKE 1 TABLET (15 MG TOTAL) AT BEDTIME 90 tablet 1   omeprazole (PRILOSEC) 20 MG capsule TAKE 1 CAPSULE BY MOUTH EVERY DAY 90 capsule 0   pravastatin (PRAVACHOL) 40 MG tablet TAKE 1 TABLET BY MOUTH EVERY DAY 90 tablet 1   VITAMIN D PO Take by mouth.     Social History   Socioeconomic History   Marital status: Divorced    Spouse name: Not on file   Number of children: 2   Years of education: Not on file   Highest education level: Not on file  Occupational History   Occupation: homemaker  Tobacco Use   Smoking status: Never   Smokeless tobacco: Never  Vaping Use   Vaping Use: Never used  Substance and Sexual Activity   Alcohol use: Yes    Comment: very rare that she has a drink   Drug use: No   Sexual activity: Never    Partners: Male  Other Topics Concern   Not on file  Social History Narrative   Lives with daughter [name]   Right handed    2 cups of coffee daily  Social Determinants of Health   Financial Resource Strain: Low Risk  (07/27/2022)   Overall Financial Resource Strain (CARDIA)    Difficulty of Paying Living Expenses: Not very hard  Food Insecurity: No Food Insecurity (07/27/2022)   Hunger Vital Sign    Worried About Running Out of Food in the Last Year: Never true    Ran Out of Food in the Last Year: Never true  Transportation Needs: No Transportation Needs (07/27/2022)   PRAPARE - Hydrologist (Medical): No    Lack of Transportation (Non-Medical): No  Physical Activity: Inactive (07/27/2022)   Exercise Vital Sign    Days of Exercise per Week: 0 days    Minutes of Exercise per Session: 0 min  Stress: No Stress Concern Present (07/27/2022)   [place]    Feeling of Stress : Not at all  Social Connections: Not on file   Intimate Partner Violence: Not on file   Family History  Problem Relation Age of Onset   Esophageal cancer Father        died at 29   Cancer Father    Colon cancer Sister 74       died at 23   Colon polyps Neg Hx    Rectal cancer Neg Hx    Stomach cancer Neg Hx     OBJECTIVE:  Vitals:   08/05/22 1039  BP: 129/87  Pulse: 83  Resp: 17  Temp: 98.4 F (36.9 C)  TempSrc: Oral  SpO2: 97%     Physical Exam Vitals and nursing note reviewed.  Constitutional:      General: She is not in acute distress.    Appearance: Normal appearance. She is normal weight. She is not ill-appearing, toxic-appearing or diaphoretic.  HENT:     Head: Normocephalic.  Cardiovascular:     Rate and Rhythm: Normal rate and regular rhythm.     Pulses: Normal pulses.     Heart sounds: Normal heart sounds. No murmur heard.    No friction rub. No gallop.  Pulmonary:     Effort: Pulmonary effort is normal. No respiratory distress.     Breath sounds: Normal breath sounds. No stridor. No wheezing, rhonchi or rales.  Chest:     Chest wall: No tenderness.  Skin:    General: Skin is warm.     Comments: Laceration present to right middle finger  Neurological:     Mental Status: She is alert and oriented to person, place, and time.      LABS:  No results found for this or any previous visit (from the past 24 hour(s)).   ASSESSMENT & PLAN:  1. Laceration of right middle finger without foreign body without damage to nail, initial encounter     Meds ordered this encounter  Medications   mupirocin ointment (BACTROBAN) 2 %    Sig: Apply 1 Application topically 2 (two) times daily.    Dispense:  22 g    Refill:  0   cephALEXin (KEFLEX) 500 MG capsule    Sig: Take 1 capsule (500 mg total) by mouth 4 (four) times daily.    Dispense:  20 capsule    Refill:  0   Discharge instructions  Keflex was prescribed/take as directed Bactrim was prescribed Follow with PCP Return or go to ED if you  develop any new or worsening of your symptoms  Reviewed expectations re: course of current medical issues. Questions answered.  Outlined signs and symptoms indicating need for more acute intervention. Patient verbalized understanding. After Visit Summary given.          Emerson Monte, FNP 08/05/22 1110    Emerson Monte, FNP 08/05/22 1111

## 2022-08-05 NOTE — Discharge Instructions (Signed)
Keflex was prescribed/take as directed Bactrim was prescribed Follow with PCP Return or go to ED if you develop any new or worsening of your symptoms

## 2022-08-09 ENCOUNTER — Telehealth: Payer: Self-pay | Admitting: Physician Assistant

## 2022-08-09 NOTE — Telephone Encounter (Signed)
   Linda Acevedo has been scheduled for the following appointment:  WHAT: DIAGNOSTIC MAMMOGRAM & BONE DENSITY WHERE: [facility] DATE: 12/20/22 TIME: 9:30 AM CHECK-IN  Patient has been made aware. SPOKE TO PT'S DAUGHTER

## 2022-08-21 ENCOUNTER — Other Ambulatory Visit: Payer: Self-pay

## 2022-08-21 ENCOUNTER — Encounter: Payer: Self-pay | Admitting: Physician Assistant

## 2022-08-21 ENCOUNTER — Ambulatory Visit (INDEPENDENT_AMBULATORY_CARE_PROVIDER_SITE_OTHER): Payer: 59 | Admitting: Physician Assistant

## 2022-08-21 VITALS — BP 118/80 | HR 82 | Temp 97.2°F | Ht 67.0 in | Wt 277.8 lb

## 2022-08-21 DIAGNOSIS — E559 Vitamin D deficiency, unspecified: Secondary | ICD-10-CM

## 2022-08-21 DIAGNOSIS — S022XXA Fracture of nasal bones, initial encounter for closed fracture: Secondary | ICD-10-CM

## 2022-08-21 DIAGNOSIS — G44309 Post-traumatic headache, unspecified, not intractable: Secondary | ICD-10-CM

## 2022-08-21 DIAGNOSIS — M5136 Other intervertebral disc degeneration, lumbar region: Secondary | ICD-10-CM

## 2022-08-21 DIAGNOSIS — K2100 Gastro-esophageal reflux disease with esophagitis, without bleeding: Secondary | ICD-10-CM

## 2022-08-21 DIAGNOSIS — R0609 Other forms of dyspnea: Secondary | ICD-10-CM

## 2022-08-21 DIAGNOSIS — R600 Localized edema: Secondary | ICD-10-CM

## 2022-08-21 DIAGNOSIS — R739 Hyperglycemia, unspecified: Secondary | ICD-10-CM

## 2022-08-21 DIAGNOSIS — E782 Mixed hyperlipidemia: Secondary | ICD-10-CM | POA: Diagnosis not present

## 2022-08-21 DIAGNOSIS — F419 Anxiety disorder, unspecified: Secondary | ICD-10-CM | POA: Diagnosis not present

## 2022-08-21 DIAGNOSIS — S0990XA Unspecified injury of head, initial encounter: Secondary | ICD-10-CM

## 2022-08-21 DIAGNOSIS — S0990XS Unspecified injury of head, sequela: Secondary | ICD-10-CM

## 2022-08-21 MED ORDER — LIDOCAINE 5 % EX PTCH: MEDICATED_PATCH | CUTANEOUS | 1 refills | Status: DC

## 2022-08-21 NOTE — Progress Notes (Signed)
Subjective:  Patient ID: Linda Acevedo, female    DOB: June 19, 1955  Age: 67 y.o. MRN: [id number]  Chief Complaint  Patient presents with   Hyperlipidemia    Hyperlipidemia    Mixed hyperlipidemia  Pt presents with hyperlipidemia.  Compliance with treatment has been good The patient is compliant with medications, maintains a low cholesterol diet , follows up as directed , and maintains an exercise regimen . The patient denies experiencing any hypercholesterolemia related symptoms. Currenlty on pravachol 40mg  qd  Pt with history of chronic low back pain - had been seeing [facility] for this issue - states symptoms are stable at this time and she is currently taking flexeril, mobic and uses lidoderm patch  Pt with history of intermittent lower extremity edema - uses lasix 20mg    Pt with history of GERD - stable on prilosec 20mg  qd  Pt with history of anxiety - stable on cymbalta 60mg   Pt with history of vit D def - taking otc meds because rx was too expensive - due for labwork  Daughter and pt mention bp at home has been slightly elevated in 130-140s/70s/80s - they do say her bp cuff is old however She has been complaining of some headaches but then also gives history that 3 weeks ago she fell in her chicken coop striking a post between her eyes causing black eyes and pain/swelling of face.  Today she has the tenderness on bridge of her nose and above right eye She did not seek medical attention at that time  Pt states that for more than a month or more she has had issues with exertional dyspnea.  This is new for her.  Denies chest pain or tightness.  Daughter does not notice any wheezing when she complains - just fact she cannot get good deep breath. She does see cardiology and last visit was in the fall. Current Outpatient Medications on File Prior to Visit  Medication Sig Dispense Refill   albuterol (VENTOLIN HFA) 108 (90 Base) MCG/ACT inhaler TAKE 2 PUFFS BY MOUTH  EVERY 6 HOURS AS NEEDED FOR WHEEZE OR SHORTNESS OF BREATH 8.5 each 1   clotrimazole-betamethasone (LOTRISONE) cream Apply 1 Application topically as needed. 45 g 2   cyclobenzaprine (FLEXERIL) 10 MG tablet Take by mouth.     DULoxetine (CYMBALTA) 60 MG capsule TAKE 1 CAPSULE BY MOUTH EVERY DAY 90 capsule 2   fluticasone (FLONASE) 50 MCG/ACT nasal spray SPRAY 2 SPRAYS INTO EACH NOSTRIL EVERY DAY 48 mL 1   furosemide (LASIX) 20 MG tablet TAKE 1 TABLET BY MOUTH EVERY DAY AS DIRECTED 90 tablet 1   lidocaine (LIDODERM) 5 % REMOVE & DISCARD PATCH WITHIN 12 HOURS OR AS DIRECTED BY MD 30 patch 1   meloxicam (MOBIC) 15 MG tablet TAKE 1 TABLET (15 MG TOTAL) AT BEDTIME 90 tablet 1   mupirocin ointment (BACTROBAN) 2 % Apply 1 Application topically 2 (two) times daily. 22 g 0   omeprazole (PRILOSEC) 20 MG capsule TAKE 1 CAPSULE BY MOUTH EVERY DAY 90 capsule 0   pravastatin (PRAVACHOL) 40 MG tablet TAKE 1 TABLET BY MOUTH EVERY DAY 90 tablet 1   VITAMIN D PO Take by mouth.     No current facility-administered medications on file prior to visit.   Past Medical History:  Diagnosis Date   Anxiety    Arthritis    Cataract    Degenerative disc disease    Depression    GERD (gastroesophageal reflux disease)  Headache 10/08/2018   Hyperlipidemia    Hypertension    Morbid obesity    Sleep apnea    uses a c-pap   Past Surgical History:  Procedure Laterality Date   ABLATION SAPHENOUS VEIN W/ RFA     Bil   COLONOSCOPY     JOINT REPLACEMENT     knee/right    Family History  Problem Relation Age of Onset   Esophageal cancer Father        died at 23   Cancer Father    Colon cancer Sister 58       died at 31   Colon polyps Neg Hx    Rectal cancer Neg Hx    Stomach cancer Neg Hx    Social History   Socioeconomic History   Marital status: Divorced    Spouse name: Not on file   Number of children: 2   Years of education: Not on file   Highest education level: Not on file  Occupational  History   Occupation: homemaker  Tobacco Use   Smoking status: Never   Smokeless tobacco: Never  Vaping Use   Vaping Use: Never used  Substance and Sexual Activity   Alcohol use: Yes    Comment: very rare that she has a drink   Drug use: No   Sexual activity: Never    Partners: Male  Other Topics Concern   Not on file  Social History Narrative   Lives with daughter Linda Acevedo   Right handed    2 cups of coffee daily    Social Determinants of Health   Financial Resource Strain: Low Risk  (07/27/2022)   Overall Financial Resource Strain (CARDIA)    Difficulty of Paying Living Expenses: Not very hard  Food Insecurity: No Food Insecurity (07/27/2022)   Hunger Vital Sign    Worried About Running Out of Food in the Last Year: Never true    Ran Out of Food in the Last Year: Never true  Transportation Needs: No Transportation Needs (07/27/2022)   PRAPARE - Hydrologist (Medical): No    Lack of Transportation (Non-Medical): No  Physical Activity: Inactive (07/27/2022)   Exercise Vital Sign    Days of Exercise per Week: 0 days    Minutes of Exercise per Session: 0 min  Stress: No Stress Concern Present (07/27/2022)   [place]    Feeling of Stress : Not at all  Social Connections: Not on file   CONSTITUTIONAL: Negative for chills, fatigue, fever, unintentional weight gain and unintentional weight loss.  E/N/T: Negative for ear pain, nasal congestion and sore throat.  CARDIOVASCULAR: Negative for chest pain, dizziness, palpitations and pedal edema.  RESPIRATORY: see HPI GASTROINTESTINAL: Negative for abdominal pain, acid reflux symptoms, constipation, diarrhea, nausea and vomiting.  MSK: see HPI INTEGUMENTARY: Negative for rash.  NEUROLOGICAL: see HPI PSYCHIATRIC: Negative for sleep disturbance and to question depression screen.  Negative for depression, negative for anhedonia.       Objective:   PHYSICAL EXAM:   VS: BP 118/80 (BP Location: Left Arm, Patient Position: Sitting, Cuff Size: Acevedo)   Pulse 82   Temp (!) 97.2 F (36.2 C) (Temporal)   Ht 5\' 7"  (1.702 m)   Wt 277 lb 12.8 oz (126 kg)   SpO2 94%   BMI 43.51 kg/m   GEN: Well nourished, well developed, in no acute distress  HEENT: normal external ears and nose - normal  external auditory canals and TMS - hearing grossly normal - normal nasal mucosa and septum - Lips, Teeth and Gums - normal  Oropharynx - normal mucosa, palate, and posterior pharynx Neck: no JVD or masses - no thyromegaly Cardiac: RRR; no murmurs, rubs, or gallops,no edema - Respiratory:  normal respiratory rate and pattern with no distress - normal breath sounds with no rales, rhonchi, wheezes or rubs MS: no deformity or atrophy - no bruising noted on face but does have palpable tenderness across bridge of nose and above right eye Skin: warm and dry, no rash  Neuro:  Alert and Oriented x 3,- CN II-Xii grossly intact Psych: euthymic mood, appropriate affect and demeanor  EKG - no acute changes Lab Results  Component Value Date   WBC 4.0 02/13/2022   HGB 13.4 02/13/2022   HCT 39.4 02/13/2022   PLT 209 02/13/2022   GLUCOSE 100 (H) 02/13/2022   CHOL 185 02/13/2022   TRIG 56 02/13/2022   HDL 76 02/13/2022   LDLCALC 98 02/13/2022   ALT 21 02/13/2022   AST 23 02/13/2022   NA 143 02/13/2022   K 4.5 02/13/2022   CL 104 02/13/2022   CREATININE 0.83 02/13/2022   BUN 17 02/13/2022   CO2 23 02/13/2022   TSH 2.370 02/13/2022   HGBA1C 5.7 (H) 08/08/2021      Assessment & Plan:   Problem List Items Addressed This Visit       Musculoskeletal and Integument   Lumbar degenerative disc disease -  Continue meds as directed     Other   Anxiety   Relevant Orders   TSH Continue cymbalta   Vitamin D deficiency   Relevant Orders   VITAMIN D 25 Hydroxy (Vit-D Deficiency, Fractures) Continue otc vit D   Mixed hyperlipidemia   Relevant Orders    CBC with Differential/Platelet   Comprehensive metabolic panel   Lipid panel Continue meds - watch diet   Other Visit Diagnoses     Gastroesophageal reflux disease with esophagitis without hemorrhage     Continue prilosec   Facial trauma with headaches CT of head and face ordered stat  Exertional dyspnea EKG Referral back to cardiology for further evaluation - daughter will schedule Chest xray pending  Elevated bp - stable today in office Will have her follow up in 1 month - bring her bp cuff to appt Recommend low salt diet            .  No orders of the defined types were placed in this encounter.   Orders Placed This Encounter  Procedures   CT HEAD WO CONTRAST (5MM)   CT MAXILLOFACIAL WO CONTRAST   CBC with Differential/Platelet   Comprehensive metabolic panel   TSH   Hemoglobin A1c   Lipid panel   VITAMIN D 25 Hydroxy (Vit-D Deficiency, Fractures)   EKG 12-Lead     Follow-up: Return in about 6 months (around 02/20/2023) for chronic fasting follow up.  An After Visit Summary was printed and given to the patient.  Yetta Flock [facility] [phone]

## 2022-08-22 ENCOUNTER — Other Ambulatory Visit: Payer: Self-pay

## 2022-08-22 DIAGNOSIS — G44309 Post-traumatic headache, unspecified, not intractable: Secondary | ICD-10-CM

## 2022-08-22 DIAGNOSIS — R0609 Other forms of dyspnea: Secondary | ICD-10-CM

## 2022-08-22 DIAGNOSIS — S0990XA Unspecified injury of head, initial encounter: Secondary | ICD-10-CM

## 2022-08-22 LAB — LIPID PANEL
Chol/HDL Ratio: 2.6 ratio (ref 0.0–4.4)
Cholesterol, Total: 185 mg/dL (ref 100–199)
HDL: 71 mg/dL (ref >39)
LDL Chol Calc (NIH): 101 mg/dL — ABNORMAL HIGH (ref 0–99)
Triglycerides: 72 mg/dL (ref 0–149)
VLDL Cholesterol Cal: 13 mg/dL (ref 5–40)

## 2022-08-22 LAB — CBC WITH DIFFERENTIAL/PLATELET
Basophils Absolute: 0 10*3/uL (ref 0.0–0.2)
Basos: 1 %
EOS (ABSOLUTE): 0.1 10*3/uL (ref 0.0–0.4)
Eos: 1 %
Hematocrit: 43.3 % (ref 34.0–46.6)
Hemoglobin: 14.4 g/dL (ref 11.1–15.9)
Immature Grans (Abs): 0 10*3/uL (ref 0.0–0.1)
Immature Granulocytes: 0 %
Lymphocytes Absolute: 1.7 10*3/uL (ref 0.7–3.1)
Lymphs: 44 %
MCH: 30.4 pg (ref 26.6–33.0)
MCHC: 33.3 g/dL (ref 31.5–35.7)
MCV: 91 fL (ref 79–97)
Monocytes Absolute: 0.2 10*3/uL (ref 0.1–0.9)
Monocytes: 6 %
Neutrophils Absolute: 1.8 10*3/uL (ref 1.4–7.0)
Neutrophils: 48 %
Platelets: 203 10*3/uL (ref 150–450)
RBC: 4.74 x10E6/uL (ref 3.77–5.28)
RDW: 12.8 % (ref 11.7–15.4)
WBC: 3.9 10*3/uL (ref 3.4–10.8)

## 2022-08-22 LAB — COMPREHENSIVE METABOLIC PANEL
ALT: 20 IU/L (ref 0–32)
AST: 21 IU/L (ref 0–40)
Albumin/Globulin Ratio: 1.8 (ref 1.2–2.2)
Albumin: 4.2 g/dL (ref 3.9–4.9)
Alkaline Phosphatase: 94 IU/L (ref 44–121)
BUN/Creatinine Ratio: 16 (ref 12–28)
BUN: 12 mg/dL (ref 8–27)
Bilirubin Total: 0.5 mg/dL (ref 0.0–1.2)
CO2: 22 mmol/L (ref 20–29)
Calcium: 9.6 mg/dL (ref 8.7–10.3)
Chloride: 105 mmol/L (ref 96–106)
Creatinine, Ser: 0.77 mg/dL (ref 0.57–1.00)
Globulin, Total: 2.4 g/dL (ref 1.5–4.5)
Glucose: 99 mg/dL (ref 70–99)
Potassium: 4.8 mmol/L (ref 3.5–5.2)
Sodium: 144 mmol/L (ref 134–144)
Total Protein: 6.6 g/dL (ref 6.0–8.5)
eGFR: 84 mL/min/{1.73_m2} (ref >59)

## 2022-08-22 LAB — VITAMIN D 25 HYDROXY (VIT D DEFICIENCY, FRACTURES): Vit D, 25-Hydroxy: 43.4 ng/mL (ref 30.0–100.0)

## 2022-08-22 LAB — CARDIOVASCULAR RISK ASSESSMENT

## 2022-08-22 LAB — HEMOGLOBIN A1C
Est. average glucose Bld gHb Est-mCnc: 120 mg/dL
Hgb A1c MFr Bld: 5.8 % — ABNORMAL HIGH (ref 4.8–5.6)

## 2022-08-22 LAB — TSH: TSH: 2.29 u[IU]/mL (ref 0.450–4.500)

## 2022-09-19 ENCOUNTER — Ambulatory Visit (INDEPENDENT_AMBULATORY_CARE_PROVIDER_SITE_OTHER): Payer: 59 | Admitting: Physician Assistant

## 2022-09-19 VITALS — BP 118/70 | HR 78 | Temp 97.0°F | Ht 67.0 in | Wt 278.0 lb

## 2022-09-19 DIAGNOSIS — B351 Tinea unguium: Secondary | ICD-10-CM

## 2022-09-19 DIAGNOSIS — M79672 Pain in left foot: Secondary | ICD-10-CM

## 2022-09-19 DIAGNOSIS — R0609 Other forms of dyspnea: Secondary | ICD-10-CM

## 2022-09-19 DIAGNOSIS — L03312 Cellulitis of back [any part except buttock]: Secondary | ICD-10-CM | POA: Diagnosis not present

## 2022-09-19 DIAGNOSIS — M79671 Pain in right foot: Secondary | ICD-10-CM | POA: Diagnosis not present

## 2022-09-19 DIAGNOSIS — F419 Anxiety disorder, unspecified: Secondary | ICD-10-CM

## 2022-09-19 MED ORDER — BUSPIRONE HCL 5 MG PO TABS
5.0000 mg | ORAL_TABLET | Freq: Two times a day (BID) | ORAL | 2 refills | Status: DC
Start: 2022-09-19 — End: 2022-10-11

## 2022-09-19 MED ORDER — DOXYCYCLINE HYCLATE 100 MG PO TABS
100.0000 mg | ORAL_TABLET | Freq: Two times a day (BID) | ORAL | 0 refills | Status: DC
Start: 2022-09-19 — End: 2022-10-25

## 2022-09-19 NOTE — Progress Notes (Signed)
Subjective:  Patient ID: Linda Acevedo, female    DOB: 08/25/1955  Age: 67 y.o. MRN: [id number]  Chief Complaint  Patient presents with   Elevated bp    Hypertension   Pt in today for recheck of bp.  She had stated at home she was getting intermittent elevated readings.  Daughter is with pt today and states she did buy a new cuff and getting readings mostly between 120s/80s but has had occasional reading 120-130/90.  Today bp great at 118/70.  She still denies chest pain but as discussed at last visit she has had some exertional dyspnea.  She was supposed to have made follow up visit with her cardiologist but has not scheduled yet - daughter states they will get this done  Pt complains of gait instability due to ankles 'turning in' - would like to see podiatry for possible orthotics.  Also she is still having issues with significant onychomycosis and possibly needs toenail removal Would like to see Dr Ardelle Anton  Pt and daughter states that although she takes cymbalta she has been having daily anxiety and worry - states she constantly feels that way - worried about family etc - does not like to be in crowds  Pt had tick bite on back yesterday - now red, inflamed and irritated Denies fever, malaise, headache      09/19/2022    9:39 AM 07/27/2022   12:26 PM 02/13/2022    9:07 AM 08/04/2020    9:13 AM  Depression screen PHQ 2/9  Decreased Interest 0 0 0 0  Down, Depressed, Hopeless 0 0 0 0  PHQ - 2 Score 0 0 0 0         08/04/2020    9:13 AM 02/13/2022    9:07 AM 07/27/2022   12:29 PM 08/21/2022    8:16 AM  Fall Risk  Falls in the past year? 0 0 1 1  Was there an injury with Fall? 0 0 1 0  Was there an injury with Fall? - Comments   tripped and fell causing multiple bruises on her face   Fall Risk Category Calculator 0 0 2 1  Fall Risk Category (Retired) Low Low    (RETIRED) Patient Fall Risk Level Low fall risk Low fall risk    Patient at Risk for Falls Due to No Fall Risks No  Fall Risks History of fall(s) History of fall(s)  Fall risk Follow up  Falls evaluation completed Falls evaluation completed;Education provided Falls evaluation completed      Review of Systems CONSTITUTIONAL: Negative for chills, fatigue, fever,  E/N/T: Negative for ear pain, nasal congestion and sore throat.  CARDIOVASCULAR: Negative for chest pain, dizziness, palpitations and pedal edema.  RESPIRATORY: see HPI GASTROINTESTINAL: Negative for abdominal pain, acid reflux symptoms, constipation, diarrhea, nausea and vomiting.  MSK: see HPI INTEGUMENTARY: see HPI NEUROLOGICAL: Negative for dizziness and headaches.  PSYCHIATRIC: see HPI     Current Outpatient Medications on File Prior to Visit  Medication Sig Dispense Refill   albuterol (VENTOLIN HFA) 108 (90 Base) MCG/ACT inhaler TAKE 2 PUFFS BY MOUTH EVERY 6 HOURS AS NEEDED FOR WHEEZE OR SHORTNESS OF BREATH 8.5 each 1   clotrimazole-betamethasone (LOTRISONE) cream Apply 1 Application topically as needed. 45 g 2   cyclobenzaprine (FLEXERIL) 10 MG tablet Take by mouth.     DULoxetine (CYMBALTA) 60 MG capsule TAKE 1 CAPSULE BY MOUTH EVERY DAY 90 capsule 2   fluticasone (FLONASE) 50 MCG/ACT nasal spray SPRAY 2  SPRAYS INTO EACH NOSTRIL EVERY DAY 48 mL 1   furosemide (LASIX) 20 MG tablet TAKE 1 TABLET BY MOUTH EVERY DAY AS DIRECTED 90 tablet 1   lidocaine (LIDODERM) 5 % Remove & Discard patch within 12 hours or as directed by MD 30 patch 1   meloxicam (MOBIC) 15 MG tablet TAKE 1 TABLET (15 MG TOTAL) AT BEDTIME 90 tablet 1   mupirocin ointment (BACTROBAN) 2 % Apply 1 Application topically 2 (two) times daily. 22 g 0   omeprazole (PRILOSEC) 20 MG capsule TAKE 1 CAPSULE BY MOUTH EVERY DAY 90 capsule 0   pravastatin (PRAVACHOL) 40 MG tablet TAKE 1 TABLET BY MOUTH EVERY DAY 90 tablet 1   VITAMIN D PO Take by mouth.     No current facility-administered medications on file prior to visit.   Past Medical History:  Diagnosis Date   Anxiety     Arthritis    Cataract    Degenerative disc disease    Depression    GERD (gastroesophageal reflux disease)    Headache 10/08/2018   Hyperlipidemia    Hypertension    Morbid obesity (HCC)    Sleep apnea    uses a c-pap   Past Surgical History:  Procedure Laterality Date   ABLATION SAPHENOUS VEIN W/ RFA     Bil   COLONOSCOPY     JOINT REPLACEMENT     knee/right    Family History  Problem Relation Age of Onset   Esophageal cancer Father        died at 11   Cancer Father    Colon cancer Sister 70       died at 59   Colon polyps Neg Hx    Rectal cancer Neg Hx    Stomach cancer Neg Hx    Social History   Socioeconomic History   Marital status: Divorced    Spouse name: Not on file   Number of children: 2   Years of education: Not on file   Highest education level: 3rd grade  Occupational History   Occupation: homemaker  Tobacco Use   Smoking status: Never   Smokeless tobacco: Never  Vaping Use   Vaping Use: Never used  Substance and Sexual Activity   Alcohol use: Yes    Comment: very rare that she has a drink   Drug use: No   Sexual activity: Never    Partners: Male  Other Topics Concern   Not on file  Social History Narrative   Lives with daughter [name]   Right handed    2 cups of coffee daily    Social Determinants of Health   Financial Resource Strain: Low Risk  (09/19/2022)   Overall Financial Resource Strain (CARDIA)    Difficulty of Paying Living Expenses: Not hard at all  Food Insecurity: No Food Insecurity (09/19/2022)   Hunger Vital Sign    Worried About Running Out of Food in the Last Year: Never true    Ran Out of Food in the Last Year: Never true  Transportation Needs: No Transportation Needs (09/19/2022)   PRAPARE - Administrator, Civil Service (Medical): No    Lack of Transportation (Non-Medical): No  Physical Activity: Inactive (09/19/2022)   Exercise Vital Sign    Days of Exercise per Week: 0 days    Minutes of Exercise per  Session: 0 min  Stress: Stress Concern Present (09/19/2022)   [employer] of Occupational Health - Occupational Stress Questionnaire  Feeling of Stress : Very much  Social Connections: Socially Isolated (09/19/2022)   Social Connection and Isolation Panel [NHANES]    Frequency of Communication with Friends and Family: More than three times a week    Frequency of Social Gatherings with Friends and Family: Never    Attends Religious Services: Never    Database administrator or Organizations: No    Attends Engineer, structural: Not on file    Marital Status: Divorced    Objective:  PHYSICAL EXAM:   VS: BP 118/70 (BP Location: Left Arm, Patient Position: Sitting, Cuff Size: Large)   Pulse 78   Temp (!) 97 F (36.1 C) (Temporal)   Ht 5\' 7"  (1.702 m)   Wt 278 lb (126.1 kg)   SpO2 96%   BMI 43.54 kg/m   GEN: Well nourished, well developed, in no acute distress  Cardiac: RRR; no murmurs, rubs, or gallops,no edema - Respiratory:  normal respiratory rate and pattern with no distress - normal breath sounds with no rales, rhonchi, wheezes or rubs MS: no deformity or atrophy however does limp with walking/ankles turn in Skin: warm and dry, - erythematous area on back at site of tick bite Psych: euthymic mood, appropriate affect and demeanor  Lab Results  Component Value Date   WBC 3.9 08/21/2022   HGB 14.4 08/21/2022   HCT 43.3 08/21/2022   PLT 203 08/21/2022   GLUCOSE 99 08/21/2022   CHOL 185 08/21/2022   TRIG 72 08/21/2022   HDL 71 08/21/2022   LDLCALC 101 (H) 08/21/2022   ALT 20 08/21/2022   AST 21 08/21/2022   NA 144 08/21/2022   K 4.8 08/21/2022   CL 105 08/21/2022   CREATININE 0.77 08/21/2022   BUN 12 08/21/2022   CO2 22 08/21/2022   TSH 2.290 08/21/2022   HGBA1C 5.8 (H) 08/21/2022      Assessment & Plan:    Onychomycosis -     Ambulatory referral to Podiatry  Bilateral foot pain -     Ambulatory referral to Podiatry  Exertional  dyspnea Family to call cardiology for follow up appt  Cellulitis of back Rx doxycycline 100mg  bid Anxiety Continue cymbalta Rx buspar 5mg  bid     Meds ordered this encounter  Medications   busPIRone (BUSPAR) 5 MG tablet    Sig: Take 1 tablet (5 mg total) by mouth 2 (two) times daily.    Dispense:  60 tablet    Refill:  2    Order Specific Question:   Supervising Provider    Answer:   Corey Harold   doxycycline (VIBRA-TABS) 100 MG tablet    Sig: Take 1 tablet (100 mg total) by mouth 2 (two) times daily.    Dispense:  20 tablet    Refill:  0    Order Specific Question:   Supervising Provider    AnswerCorey Harold    Orders Placed This Encounter  Procedures   Ambulatory referral to Podiatry     Follow-up: Return in about 6 weeks (around 10/31/2022) for follow up.     An After Visit Summary was printed and given to the patient.  Jettie Pagan [facility] [phone]

## 2022-09-21 ENCOUNTER — Other Ambulatory Visit: Payer: Self-pay | Admitting: Gastroenterology

## 2022-09-21 ENCOUNTER — Other Ambulatory Visit: Payer: Self-pay | Admitting: Physician Assistant

## 2022-09-21 DIAGNOSIS — Z86010 Personal history of colonic polyps: Secondary | ICD-10-CM

## 2022-09-22 ENCOUNTER — Other Ambulatory Visit: Payer: Self-pay

## 2022-09-22 ENCOUNTER — Telehealth: Payer: Self-pay

## 2022-09-22 DIAGNOSIS — F419 Anxiety disorder, unspecified: Secondary | ICD-10-CM

## 2022-09-22 MED ORDER — FLUTICASONE PROPIONATE 50 MCG/ACT NA SUSP: NASAL | 1 refills | Status: DC

## 2022-09-22 NOTE — Telephone Encounter (Signed)
Patient's daughter called stating that she has been feeling very drowsy taking the buspirone twice daily and the daughter is wanting to know if the patient can just take the 2 tablets together at night time since it does make her sleepy any ways. Please advise.

## 2022-09-22 NOTE — Telephone Encounter (Signed)
Patient's daughter was notified.

## 2022-09-25 ENCOUNTER — Other Ambulatory Visit: Payer: Self-pay | Admitting: Physician Assistant

## 2022-09-25 ENCOUNTER — Telehealth: Payer: Self-pay

## 2022-09-25 DIAGNOSIS — R0609 Other forms of dyspnea: Secondary | ICD-10-CM

## 2022-09-25 NOTE — Telephone Encounter (Signed)
done

## 2022-09-25 NOTE — Telephone Encounter (Signed)
Patient daughter called and stated that the patient's Cardiac & Thoracic Surgeon stated that she needs to be referred to cardiologist for her Blood Pressure they only do surgery in their office. Would like to go to heart care in [place]. Please advise

## 2022-10-02 ENCOUNTER — Encounter: Payer: Self-pay | Admitting: Podiatry

## 2022-10-02 ENCOUNTER — Ambulatory Visit (INDEPENDENT_AMBULATORY_CARE_PROVIDER_SITE_OTHER): Payer: 59

## 2022-10-02 ENCOUNTER — Ambulatory Visit (INDEPENDENT_AMBULATORY_CARE_PROVIDER_SITE_OTHER): Payer: 59 | Admitting: Podiatry

## 2022-10-02 DIAGNOSIS — M7751 Other enthesopathy of right foot: Secondary | ICD-10-CM | POA: Diagnosis not present

## 2022-10-02 DIAGNOSIS — Q666 Other congenital valgus deformities of feet: Secondary | ICD-10-CM

## 2022-10-02 DIAGNOSIS — M7752 Other enthesopathy of left foot: Secondary | ICD-10-CM | POA: Diagnosis not present

## 2022-10-02 DIAGNOSIS — L603 Nail dystrophy: Secondary | ICD-10-CM | POA: Diagnosis not present

## 2022-10-02 DIAGNOSIS — M19079 Primary osteoarthritis, unspecified ankle and foot: Secondary | ICD-10-CM | POA: Diagnosis not present

## 2022-10-02 DIAGNOSIS — M7750 Other enthesopathy of unspecified foot: Secondary | ICD-10-CM

## 2022-10-02 NOTE — Patient Instructions (Addendum)
Instrucciones de remojo  El dia despues del procedimiento:  Coloque 1/4 taza de sal de epsom en un litro de agua tibia del grifo. Sumerja su pie o pies con el vendaje externo intacto para el remojo inicial; esto permitira que el vendaje se humdezca y humedezca  para despegarlo facilmente. Una ves que retire su vendaje, continue remojando la solucion durante 20 minutos. Este remojo deber hacerse dos veces al dia. Luego, retire su pie o pies de la solucion, seque el area afectada y cubra. Puede usar una curita lo suficientemente grande como para cubrir el area o usar una gasa y cinta adhesive. Aplique otros medicamentos en el area segun las indicaciones del medico, como la polisporina neosporina.    SI SU PIEL SE IRRITA AL USAR ESTAS INSTRUCCIONES, ES ACEPTABLE CAMBIARSE AL VINAGRE BLANCO Y AL AGUA. O puede usar agua y jabon antibacterial para mantener limpio el dedo del pie.   Monitoree cualquier signo/sintoma de infeccion. Llame a la oficina de inmediato si occure o vaya directament a la sal de emergencias. Llame con cualquier pregunta/inquitud.   

## 2022-10-02 NOTE — Progress Notes (Signed)
Subjective:   Patient ID: Linda Acevedo, female   DOB: 67 y.o.   MRN: [id number]   HPI Chief Complaint  Patient presents with   Nail Problem    Toenails bilateral - toenails thick and dark x years, hallux right is tender with shoes   Foot Pain    Medial/lateral foot bilateral - daughter states she walks with her feet turned out, feet swell, wonders if there is something that can give her more support   New Patient (Initial Visit)   67 year old female presents with her daughter today who helps translate for 2 concerns.  She is getting pain to her feet but more to her knees that her feet turn out.  She has tried over-the-counter inserts and shoe modification significant improvement.  She does get swelling she wears compression intermittently.  No injuries to her feet.  She states that both her big toenails and thickened for quite some time she is try to her medications no causing pain.  She is interested to have the nails removed.  Review of Systems  All other systems reviewed and are negative.  Past Medical History:  Diagnosis Date   Anxiety    Arthritis    Cataract    Degenerative disc disease    Depression    GERD (gastroesophageal reflux disease)    Headache 10/08/2018   Hyperlipidemia    Hypertension    Morbid obesity (HCC)    Sleep apnea    uses a c-pap    Past Surgical History:  Procedure Laterality Date   ABLATION SAPHENOUS VEIN W/ RFA     Bil   COLONOSCOPY     JOINT REPLACEMENT     knee/right     Current Outpatient Medications:    albuterol (VENTOLIN HFA) 108 (90 Base) MCG/ACT inhaler, TAKE 2 PUFFS BY MOUTH EVERY 6 HOURS AS NEEDED FOR WHEEZE OR SHORTNESS OF BREATH, Disp: 8.5 each, Rfl: 1   busPIRone (BUSPAR) 5 MG tablet, Take 1 tablet (5 mg total) by mouth 2 (two) times daily., Disp: 60 tablet, Rfl: 2   clotrimazole-betamethasone (LOTRISONE) cream, Apply 1 Application topically as needed., Disp: 45 g, Rfl: 2   cyclobenzaprine (FLEXERIL) 10 MG tablet,  Take by mouth., Disp: , Rfl:    doxycycline (VIBRA-TABS) 100 MG tablet, Take 1 tablet (100 mg total) by mouth 2 (two) times daily., Disp: 20 tablet, Rfl: 0   DULoxetine (CYMBALTA) 60 MG capsule, TAKE 1 CAPSULE BY MOUTH EVERY DAY, Disp: 90 capsule, Rfl: 2   fluticasone (FLONASE) 50 MCG/ACT nasal spray, SPRAY 2 SPRAYS INTO EACH NOSTRIL EVERY DAY, Disp: 48 mL, Rfl: 1   furosemide (LASIX) 20 MG tablet, TAKE 1 TABLET BY MOUTH EVERY DAY AS DIRECTED, Disp: 90 tablet, Rfl: 1   lidocaine (LIDODERM) 5 %, Remove & Discard patch within 12 hours or as directed by MD, Disp: 30 patch, Rfl: 1   meloxicam (MOBIC) 15 MG tablet, TAKE 1 TABLET (15 MG TOTAL) AT BEDTIME, Disp: 90 tablet, Rfl: 1   mupirocin ointment (BACTROBAN) 2 %, Apply 1 Application topically 2 (two) times daily., Disp: 22 g, Rfl: 0   omeprazole (PRILOSEC) 20 MG capsule, TAKE 1 CAPSULE BY MOUTH EVERY DAY, Disp: 90 capsule, Rfl: 0   pravastatin (PRAVACHOL) 40 MG tablet, TAKE 1 TABLET BY MOUTH EVERY DAY, Disp: 90 tablet, Rfl: 1   VITAMIN D PO, Take by mouth., Disp: , Rfl:   Allergies  Allergen Reactions   Gabapentin Swelling and Anaphylaxis    Face swelling  Objective:  Physical Exam  General: AAO x3, NAD  Dermatological: Bilateral hallux nails as well as second digit nails are hypertrophic, dystrophic with yellow discoloration and brown discoloration.  No edema, erythema or signs of infection.  There are no open lesions.  Vascular: Dorsalis Pedis artery and Posterior Tibial artery pedal pulses are 2/4 bilateral with immedate capillary fill time.  There is no pain with calf compression, swelling, warmth, erythema.   Neruologic: Grossly intact via light touch bilateral.   Musculoskeletal: Decreased arch height upon weightbearing on her feet still externally rotated.  Ankle range of motion intact.  There is no area of pinpoint tenderness directly but she has tenderness on both of her big toenails.  Muscular strength 5/5 in all  groups tested bilateral.  Gait: Unassisted, Nonantalgic.       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  3 views of bilateral feet as well as AP of the ankle were obtained.  Narrowing of the ankle joint space.  Arthritic changes present to the midfoot.  Decreased calcaneal position both.  Calcaneal spurring is present. -Regards to her feet we discussed different bracing versus inserts.  Will try custom-made orthotics with this is not a guarantee it will help straighten out but hopefully help with other discomfort and give her better arch support than OTC.  -We discussed different treatment options for the nails.  After discussion regards to treatment options she wants to proceed with total nail avulsion.  We discussed intact that is not a guarantee and the nails could come back and thick or discolored or not at all.  She does not want to have a chemical matricectomy. -At this time, patient is requesting total nail removal without chemical matricectomy to the left and right hallux toenail due to pain. Risks and complications were discussed with the patient for which they understand and  verbally consent to the procedure. Under sterile conditions a total of 3 mL of a mixture of 2% lidocaine plain and 0.5% Marcaine plain was infiltrated in a hallux block fashion. Once anesthetized, the skin was prepped in sterile fashion. A tourniquet was then applied. Next the left and right nails removed in total making sure not all nail borders.  Once the nail was  Rroved, the area was debrided and the underlying skin was intact. The area was irrigated and hemostasis was obtained.  A dry sterile dressing was applied. After application of the dressing the tourniquet was removed and there is found to be an immediate  capillary refill time to the digit. The patient tolerated the procedure well any complications. Post procedure instructions were discussed the patient for which he verbally understood. Follow-up in one week for nail check or sooner if any problems are to arise. Discussed signs/symptoms of worsening infection and directed to call the office immediately should any occur or go directly to the emergency room. In the meantime, encouraged to call the office with any questions, concerns, changes symptoms. -Nail sent for culture  Return in about 2 weeks (around 10/16/2022) for nail check, culture, orthotics .   Vivi Barrack DPM

## 2022-10-05 ENCOUNTER — Ambulatory Visit: Payer: 59 | Attending: Internal Medicine | Admitting: Internal Medicine

## 2022-10-05 VITALS — BP 122/88 | HR 77 | Ht 67.0 in | Wt 271.6 lb

## 2022-10-05 DIAGNOSIS — R0609 Other forms of dyspnea: Secondary | ICD-10-CM | POA: Diagnosis not present

## 2022-10-05 MED ORDER — LOSARTAN POTASSIUM 25 MG PO TABS: 25.0000 mg | ORAL_TABLET | Freq: Every day | ORAL | 3 refills | Status: DC

## 2022-10-05 NOTE — Patient Instructions (Signed)
Medication Instructions:   START: LOSARTAN 25mg  ONCE DAILY   *If you need a refill on your cardiac medications before your next appointment, please call your pharmacy*   Lab Work:  BLOOD WORK TODAY AND THEN   Please return for Blood Work in 2 WEEKS. No appointment needed, lab here at the office is open Monday-Friday from 8AM to 4PM and closed daily for lunch from 12:45-1:45.   If you have labs (blood work) drawn today and your tests are completely normal, you will receive your results only by: MyChart Message (if you have MyChart) OR A paper copy in the mail If you have any lab test that is abnormal or we need to change your treatment, we will call you to review the results.  Testing/Procedures: Your physician has requested that you have an echocardiogram. Echocardiography is a painless test that uses sound waves to create images of your heart. It provides your doctor with information about the size and shape of your heart and how well your heart's chambers and valves are working. You may receive an ultrasound enhancing agent through an IV if needed to better visualize your heart during the echo.This procedure takes approximately one hour. There are no restrictions for this procedure. This will take place at the [address], [address].   Your physician has requested that you have a lexiscan myoview. For further information please visit [email or web address] Please follow instruction sheet, as given.  Follow-Up: At [facility], you and your health needs are our priority.  As part of our continuing mission to provide you with exceptional heart care, we have created designated Provider Care Teams.  These Care Teams include your primary Cardiologist (physician) and Advanced Practice Providers (APPs -  Physician Assistants and Nurse Practitioners) who all work together to provide you with the care you need, when you need it.  We recommend signing up for the patient portal called  "MyChart".  Sign up information is provided on this After Visit Summary.  MyChart is used to connect with patients for Virtual Visits (Telemedicine).  Patients are able to view lab/test results, encounter notes, upcoming appointments, etc.  Non-urgent messages can be sent to your provider as well.   To learn more about what you can do with MyChart, go to [email or web address].    Your next appointment:   6 month(s)  Provider:   Maisie Fus, MD

## 2022-10-05 NOTE — Progress Notes (Signed)
Cardiology Office Note:    Date:  10/05/2022   ID:  Linda Acevedo, DOB Sep 12, 1955, MRN [id number]  PCP:  Marianne Sofia, [facility] Providers Cardiologist:  None     Referring MD: Marianne Sofia, PA-C   No chief complaint on file. Dyspnea  History of Present Illness:    Linda Acevedo is a 67 y.o. female with a hx of anxiety, arthritis, GERD, OSA on cpap, referral for dyspnea  Saw Dr. Graciela Husbands in 2012. She had hx of chronic dyspnea on exertion and some lower extremity and upper extremity swelling. Echo at that time showed mild PHTN. LV was noted to be dilated. EF was normal.   Daughter is providing the hx. Notes that with her daily activities she is out of breath.  Notes decline after falling in March of this year.  She notes just walking can contribute to SOB. She has some chest pressure the other day, doesn't remember what she was doing. Has chronic leg swelling. She has an adjustable bed. Sleeps with her CPAP. No smoking. Blood pressures at home 130s/140s. Here 122/88 mmHg.    Past Medical History:  Diagnosis Date   Anxiety    Arthritis    Cataract    Degenerative disc disease    Depression    GERD (gastroesophageal reflux disease)    Headache 10/08/2018   Hyperlipidemia    Hypertension    Morbid obesity (HCC)    Sleep apnea    uses a c-pap    Past Surgical History:  Procedure Laterality Date   ABLATION SAPHENOUS VEIN W/ RFA     Bil   COLONOSCOPY     JOINT REPLACEMENT     knee/right    Current Medications: No outpatient medications have been marked as taking for the 10/05/22 encounter (Appointment) with Maisie Fus, MD.     Allergies:   Gabapentin   Social History   Socioeconomic History   Marital status: Divorced    Spouse name: Not on file   Number of children: 2   Years of education: Not on file   Highest education level: 3rd grade  Occupational History   Occupation: homemaker  Tobacco Use   Smoking status: Never    Smokeless tobacco: Never  Vaping Use   Vaping Use: Never used  Substance and Sexual Activity   Alcohol use: Yes    Comment: very rare that she has a drink   Drug use: No   Sexual activity: Never    Partners: Male  Other Topics Concern   Not on file  Social History Narrative   Lives with daughter Linda Acevedo   Right handed    2 cups of coffee daily    Social Determinants of Health   Financial Resource Strain: Low Risk  (09/19/2022)   Overall Financial Resource Strain (CARDIA)    Difficulty of Paying Living Expenses: Not hard at all  Food Insecurity: No Food Insecurity (09/19/2022)   Hunger Vital Sign    Worried About Running Out of Food in the Last Year: Never true    Ran Out of Food in the Last Year: Never true  Transportation Needs: No Transportation Needs (09/19/2022)   PRAPARE - Administrator, Civil Service (Medical): No    Lack of Transportation (Non-Medical): No  Physical Activity: Inactive (09/19/2022)   Exercise Vital Sign    Days of Exercise per Week: 0 days    Minutes of Exercise per Session: 0 min  Stress:  Stress Concern Present (09/19/2022)   Linda Acevedo    Feeling of Stress : Very much  Social Connections: Socially Isolated (09/19/2022)   Social Connection and Isolation Panel [NHANES]    Frequency of Communication with Friends and Family: More than three times a week    Frequency of Social Gatherings with Friends and Family: Never    Attends Religious Services: Never    Database administrator or Organizations: No    Attends Engineer, structural: Not on file    Marital Status: Divorced     Family History: The patient's family history includes Cancer in her father; Colon cancer (age of onset: 29) in her sister; Esophageal cancer in her father. There is no history of Colon polyps, Rectal cancer, or Stomach cancer.  ROS:   Please see the history of present illness.     All other  systems reviewed and are negative.  EKGs/Labs/Other Studies Reviewed:    The following studies were reviewed today:   EKG:  EKG is  ordered today.  The ekg ordered today demonstrates   10/05/2022- NSR with occasional PVCs  Recent Labs: 08/21/2022: ALT 20; BUN 12; Creatinine, Ser 0.77; Hemoglobin 14.4; Platelets 203; Potassium 4.8; Sodium 144; TSH 2.290   Recent Lipid Panel    Component Value Date/Time   CHOL 185 08/21/2022 0907   TRIG 72 08/21/2022 0907   HDL 71 08/21/2022 0907   CHOLHDL 2.6 08/21/2022 0907   LDLCALC 101 (H) 08/21/2022 0907     Risk Assessment/Calculations:    Physical Exam:    VS:  Vitals:   10/05/22 0912  BP: 122/88  Pulse: 77  SpO2: 100%    Wt Readings from Last 3 Encounters:  09/19/22 278 lb (126.1 kg)  08/21/22 277 lb 12.8 oz (126 kg)  07/10/22 274 lb 12.8 oz (124.6 kg)     GEN: Obese,  Well nourished, well developed in no acute distress HEENT: Normal NECK: No JVD; No carotid bruits LYMPHATICS: No lymphadenopathy CARDIAC: RRR, no murmurs, rubs, gallops RESPIRATORY:  Clear to auscultation without rales, wheezing or rhonchi  ABDOMEN: Soft, non-tender, non-distended MUSCULOSKELETAL:  No edema; No deformity  SKIN: Warm and dry NEUROLOGIC:  Alert and oriented x 3 PSYCHIATRIC:  Normal affect   ASSESSMENT:    DOE: - DDX includes HFpEF vs. CAD. Challenging considering cannot communicate directly  - plan for work up below - lasix 20 mg daily to see if helps with DOE - weight is a significant contributor to her symptoms, BMI 42. discussed reducing salt intake 2g per day, reducing proportion sizes, no high caloric foods, more whole wheat  HLD: pravastatin 40 mg daily  BP: notes high diastolic BP at home. continue home monitoring. Start losartan 25 mg daily. Recommended lasix daily PLAN:    In order of problems listed above:  TTE Lexiscan SPECT BNP Start losartan 25 mg daily BMET 2 weeks Follow up 6 months      Shared Decision  Making/Informed Consent The risks [chest pain, shortness of breath, cardiac arrhythmias, dizziness, blood pressure fluctuations, myocardial infarction, stroke/transient ischemic attack, nausea, vomiting, allergic reaction, radiation exposure, metallic taste sensation and life-threatening complications (estimated to be 1 in 10,000)], benefits (risk stratification, diagnosing coronary artery disease, treatment guidance) and alternatives of a nuclear stress test were discussed in detail with [name] and she agrees to proceed.    Medication Adjustments/Labs and Tests Ordered: Current medicines are reviewed at length with the patient  today.  Concerns regarding medicines are outlined above.  No orders of the defined types were placed in this encounter.  No orders of the defined types were placed in this encounter.   There are no Patient Instructions on file for this visit.   Signed, Maisie Fus, MD  10/05/2022 8:21 AM    [facility]

## 2022-10-06 LAB — BRAIN NATRIURETIC PEPTIDE: BNP: 14.5 pg/mL (ref 0.0–100.0)

## 2022-10-09 ENCOUNTER — Ambulatory Visit: Payer: 59 | Admitting: Podiatry

## 2022-10-11 ENCOUNTER — Other Ambulatory Visit: Payer: Self-pay | Admitting: Physician Assistant

## 2022-10-11 DIAGNOSIS — F419 Anxiety disorder, unspecified: Secondary | ICD-10-CM

## 2022-10-15 ENCOUNTER — Other Ambulatory Visit: Payer: Self-pay | Admitting: Family Medicine

## 2022-10-18 ENCOUNTER — Other Ambulatory Visit: Payer: Self-pay | Admitting: Internal Medicine

## 2022-10-18 ENCOUNTER — Encounter (HOSPITAL_COMMUNITY): Payer: Self-pay | Admitting: *Deleted

## 2022-10-18 ENCOUNTER — Telehealth (HOSPITAL_COMMUNITY): Payer: Self-pay | Admitting: *Deleted

## 2022-10-18 DIAGNOSIS — R0600 Dyspnea, unspecified: Secondary | ICD-10-CM

## 2022-10-18 NOTE — Telephone Encounter (Signed)
My Chart letter sent outlining instructions for upcoming test on 10/23/22 at 8:15.

## 2022-10-20 ENCOUNTER — Ambulatory Visit (INDEPENDENT_AMBULATORY_CARE_PROVIDER_SITE_OTHER): Payer: 59 | Admitting: Podiatry

## 2022-10-20 ENCOUNTER — Other Ambulatory Visit: Payer: Self-pay

## 2022-10-20 DIAGNOSIS — M19079 Primary osteoarthritis, unspecified ankle and foot: Secondary | ICD-10-CM

## 2022-10-20 DIAGNOSIS — M778 Other enthesopathies, not elsewhere classified: Secondary | ICD-10-CM

## 2022-10-20 DIAGNOSIS — L603 Nail dystrophy: Secondary | ICD-10-CM | POA: Diagnosis not present

## 2022-10-20 DIAGNOSIS — R0609 Other forms of dyspnea: Secondary | ICD-10-CM

## 2022-10-20 DIAGNOSIS — Q666 Other congenital valgus deformities of feet: Secondary | ICD-10-CM

## 2022-10-20 MED ORDER — LOSARTAN POTASSIUM 25 MG PO TABS: 25.0000 mg | ORAL_TABLET | Freq: Every day | ORAL | 3 refills | Status: DC

## 2022-10-21 LAB — BASIC METABOLIC PANEL
BUN/Creatinine Ratio: 18 (ref 12–28)
BUN: 13 mg/dL (ref 8–27)
CO2: 25 mmol/L (ref 20–29)
Calcium: 9.1 mg/dL (ref 8.7–10.3)
Chloride: 106 mmol/L (ref 96–106)
Creatinine, Ser: 0.71 mg/dL (ref 0.57–1.00)
Glucose: 92 mg/dL (ref 70–99)
Potassium: 4.5 mmol/L (ref 3.5–5.2)
Sodium: 140 mmol/L (ref 134–144)
eGFR: 93 mL/min/{1.73_m2} (ref >59)

## 2022-10-23 ENCOUNTER — Other Ambulatory Visit: Payer: Self-pay | Admitting: Physician Assistant

## 2022-10-23 ENCOUNTER — Other Ambulatory Visit: Payer: Self-pay

## 2022-10-23 ENCOUNTER — Encounter (HOSPITAL_COMMUNITY): Payer: Self-pay | Admitting: *Deleted

## 2022-10-23 ENCOUNTER — Ambulatory Visit (HOSPITAL_COMMUNITY): Payer: 59 | Attending: Internal Medicine

## 2022-10-23 DIAGNOSIS — R0609 Other forms of dyspnea: Secondary | ICD-10-CM

## 2022-10-23 DIAGNOSIS — F419 Anxiety disorder, unspecified: Secondary | ICD-10-CM

## 2022-10-23 DIAGNOSIS — M5136 Other intervertebral disc degeneration, lumbar region: Secondary | ICD-10-CM

## 2022-10-23 DIAGNOSIS — R0600 Dyspnea, unspecified: Secondary | ICD-10-CM

## 2022-10-23 LAB — MYOCARDIAL PERFUSION IMAGING: Rest Nuclear Isotope Dose: 31.6 mCi

## 2022-10-23 MED ORDER — CLOTRIMAZOLE-BETAMETHASONE 1-0.05 % EX CREA: 1.0000 | TOPICAL_CREAM | Freq: Two times a day (BID) | CUTANEOUS | 2 refills | Status: DC

## 2022-10-23 MED ORDER — MELOXICAM 15 MG PO TABS: ORAL_TABLET | ORAL | 1 refills | Status: DC

## 2022-10-23 MED ORDER — FLUTICASONE PROPIONATE 50 MCG/ACT NA SUSP: NASAL | 1 refills | Status: DC

## 2022-10-23 MED ORDER — LIDOCAINE 5 % EX PTCH
MEDICATED_PATCH | CUTANEOUS | 1 refills | Status: DC
Start: 2022-10-23 — End: 2022-10-23

## 2022-10-23 MED ORDER — PRAVASTATIN SODIUM 40 MG PO TABS: 40.0000 mg | ORAL_TABLET | Freq: Every day | ORAL | 1 refills | Status: DC

## 2022-10-23 MED ORDER — DULOXETINE HCL 60 MG PO CPEP: ORAL_CAPSULE | ORAL | 0 refills | Status: DC

## 2022-10-23 MED ORDER — ALBUTEROL SULFATE HFA 108 (90 BASE) MCG/ACT IN AERS
INHALATION_SPRAY | RESPIRATORY_TRACT | 1 refills | Status: DC
Start: 2022-10-23 — End: 2022-10-29

## 2022-10-23 MED ORDER — LIDOCAINE 5 % EX PTCH
MEDICATED_PATCH | CUTANEOUS | 1 refills | Status: DC
Start: 2022-10-23 — End: 2023-02-26

## 2022-10-23 MED ORDER — OMEPRAZOLE 20 MG PO CPDR: DELAYED_RELEASE_CAPSULE | ORAL | 0 refills | Status: DC

## 2022-10-23 MED ORDER — BUSPIRONE HCL 5 MG PO TABS
5.0000 mg | ORAL_TABLET | Freq: Two times a day (BID) | ORAL | 1 refills | Status: DC
Start: 2022-10-23 — End: 2023-03-12

## 2022-10-23 MED ORDER — TECHNETIUM TC 99M TETROFOSMIN IV KIT
31.6000 | PACK | Freq: Once | INTRAVENOUS | Status: AC | PRN
  Administered 2022-10-23: 31.6 via INTRAVENOUS

## 2022-10-24 ENCOUNTER — Ambulatory Visit (HOSPITAL_COMMUNITY): Payer: 59

## 2022-10-24 DIAGNOSIS — R0609 Other forms of dyspnea: Secondary | ICD-10-CM | POA: Diagnosis not present

## 2022-10-24 LAB — MYOCARDIAL PERFUSION IMAGING
LV dias vol: 106 mL (ref 46–106)
LV sys vol: 41 mL
Nuc Stress EF: 61 %
Peak HR: 81 {beats}/min
Rest HR: 81 {beats}/min
SDS: 2
SRS: 0
SSS: 2
ST Depression (mm): 0 mm
Stress Nuclear Isotope Dose: 32.9 mCi
TID: 1

## 2022-10-24 MED ORDER — REGADENOSON 0.4 MG/5ML IV SOLN
0.4000 mg | Freq: Once | INTRAVENOUS | Status: AC
Start: 2022-10-24 — End: 2022-10-24
  Administered 2022-10-24: 0.4 mg via INTRAVENOUS

## 2022-10-24 MED ORDER — TECHNETIUM TC 99M TETROFOSMIN IV KIT
32.9000 | PACK | Freq: Once | INTRAVENOUS | Status: AC | PRN
  Administered 2022-10-24: 32.9 via INTRAVENOUS

## 2022-10-25 ENCOUNTER — Ambulatory Visit (INDEPENDENT_AMBULATORY_CARE_PROVIDER_SITE_OTHER): Payer: 59 | Admitting: Physician Assistant

## 2022-10-25 ENCOUNTER — Encounter: Payer: Self-pay | Admitting: Physician Assistant

## 2022-10-25 ENCOUNTER — Other Ambulatory Visit: Payer: Self-pay

## 2022-10-25 VITALS — BP 120/86 | HR 73 | Temp 97.6°F | Ht 67.0 in | Wt 277.8 lb

## 2022-10-25 DIAGNOSIS — F419 Anxiety disorder, unspecified: Secondary | ICD-10-CM | POA: Diagnosis not present

## 2022-10-25 DIAGNOSIS — I10 Essential (primary) hypertension: Secondary | ICD-10-CM | POA: Diagnosis not present

## 2022-10-25 DIAGNOSIS — M5136 Other intervertebral disc degeneration, lumbar region: Secondary | ICD-10-CM

## 2022-10-25 NOTE — Progress Notes (Signed)
Subjective: Chief Complaint  Patient presents with   Foot Pain    Rm 13 Follow up left foot pain. Pt states improvement.    Ingrown Toenail    Bilateral hallux nail check. Pt states she is doing well no complaints per interpreter.    Foot Orthotics    Casting for Orthotics today.    67 year old female presents with above concerns.  She states that she is doing well after having the right big toenail removed.  Not on any pain this time.  No swelling redness or drainage.  She is also presents today get measured for orthotics.   Objective: AAO x3, NAD DP/PT pulses palpable bilaterally, CRT less than 3 seconds Status post right total nail avulsion.  Minimal scab is present.  There is no edema, erythema or signs of infection.  No tenderness palpation. Otherwise exam unchanged. No pain with calf compression, swelling, warmth, erythema  Assessment: S/p total nail removal, orthotic casting  Plan: -All treatment options discussed with the patient including all alternatives, risks, complications.  -Procedure site appears to be healing well.  Discussed washing with soap and water.  Dressed given the pain is under daily, the area open at nighttime.  Monitor any signs or symptoms of infection or reoccurrence. -Measured for orthotics today.  -Patient encouraged to call the office with any questions, concerns, change in symptoms.   Vivi Barrack DPM

## 2022-10-25 NOTE — Progress Notes (Signed)
Subjective:  Patient ID: Linda Acevedo, female    DOB: [birth year]  Age: 67 y.o. MRN: [id number]  Chief Complaint  Patient presents with   Medical Management of Chronic Issues    HPI Pt in for follow up of hypertension and dyspnea.  She has seen cardiology and had stress test yesterday - she is scheduled for echocardiogram next week.  She was started on losartan 25mg  a few weeks ago.  Is tolerating medication well and bp has been doing better  Pt has not been taking buspar twice daily because the first few times she took it she thought it made her sleepy.  She is taking med at night.  She is having some relief of anxiety symptoms however will try taking bid again.  Is having a lot of family issues with illness right now and more stressed than usual     10/25/2022    8:24 AM 09/19/2022    9:39 AM 07/27/2022   12:26 PM 02/13/2022    9:07 AM 08/04/2020    9:13 AM  Depression screen PHQ 2/9  Decreased Interest 0 0 0 0 0  Down, Depressed, Hopeless 1 0 0 0 0  PHQ - 2 Score 1 0 0 0 0  Altered sleeping 0      Tired, decreased energy 0      Change in appetite 0      Feeling bad or failure about yourself  0      Trouble concentrating 0      Moving slowly or fidgety/restless 0      Suicidal thoughts 0      PHQ-9 Score 1      Difficult doing work/chores Not difficult at all            08/04/2020    9:13 AM 02/13/2022    9:07 AM 07/27/2022   12:29 PM 08/21/2022    8:16 AM 10/25/2022    8:23 AM  Fall Risk  Falls in the past year? 0 0 1 1 0  Was there an injury with Fall? 0 0 1 0 0  Was there an injury with Fall? - Comments   tripped and fell causing multiple bruises on her face    Fall Risk Category Calculator 0 0 2 1 0  Fall Risk Category (Retired) Low Low     (RETIRED) Patient Fall Risk Level Low fall risk Low fall risk     Patient at Risk for Falls Due to No Fall Risks No Fall Risks History of fall(s) History of fall(s) No Fall Risks  Fall risk Follow up  Falls evaluation completed  Falls evaluation completed;Education provided Falls evaluation completed Falls evaluation completed     ROS CONSTITUTIONAL: Negative for chills, fatigue, fever,  CARDIOVASCULAR: Negative for chest pain, dizziness, palpitations  RESPIRATORY: Negative for recent cough and dyspnea.  PSYCHIATRIC: Negative for sleep disturbance and to question depression screen.  Negative for depression, negative for anhedonia.    Current Outpatient Medications:    albuterol (VENTOLIN HFA) 108 (90 Base) MCG/ACT inhaler, TAKE 2 PUFFS BY MOUTH EVERY 6 HOURS AS NEEDED FOR WHEEZE OR SHORTNESS OF BREATH, Disp: 8.5 each, Rfl: 1   busPIRone (BUSPAR) 5 MG tablet, Take 1 tablet (5 mg total) by mouth 2 (two) times daily., Disp: 180 tablet, Rfl: 1   clotrimazole-betamethasone (LOTRISONE) cream, Apply 1 Application topically 2 (two) times daily., Disp: 45 g, Rfl: 2   cyclobenzaprine (FLEXERIL) 10 MG tablet, Take by mouth., Disp: , Rfl:  DULoxetine (CYMBALTA) 60 MG capsule, TAKE 1 CAPSULE BY MOUTH EVERY DAY, Disp: 90 capsule, Rfl: 0   fluticasone (FLONASE) 50 MCG/ACT nasal spray, SPRAY 2 SPRAYS INTO EACH NOSTRIL EVERY DAY, Disp: 48 mL, Rfl: 1   furosemide (LASIX) 20 MG tablet, TAKE 1 TABLET BY MOUTH EVERY DAY AS DIRECTED, Disp: 90 tablet, Rfl: 1   lidocaine (LIDODERM) 5 %, 1 patch qd, Disp: 30 patch, Rfl: 1   losartan (COZAAR) 25 MG tablet, Take 1 tablet (25 mg total) by mouth daily., Disp: 90 tablet, Rfl: 3   meloxicam (MOBIC) 15 MG tablet, TAKE 1 TABLET (15 MG TOTAL) AT BEDTIME, Disp: 90 tablet, Rfl: 1   mupirocin ointment (BACTROBAN) 2 %, Apply 1 Application topically 2 (two) times daily., Disp: 22 g, Rfl: 0   omeprazole (PRILOSEC) 20 MG capsule, TAKE 1 CAPSULE BY MOUTH EVERY DAY, Disp: 90 capsule, Rfl: 0   pravastatin (PRAVACHOL) 40 MG tablet, Take 1 tablet (40 mg total) by mouth daily., Disp: 90 tablet, Rfl: 1   VITAMIN D PO, Take by mouth., Disp: , Rfl:   Past Medical History:  Diagnosis Date   Anxiety     Arthritis    Cataract    Degenerative disc disease    Depression    GERD (gastroesophageal reflux disease)    Headache 10/08/2018   Hyperlipidemia    Hypertension    Morbid obesity (HCC)    Sleep apnea    uses a c-pap   Objective:  PHYSICAL EXAM:   BP 120/86 (BP Location: Left Arm, Patient Position: Sitting, Cuff Size: Large)   Pulse 73   Temp 97.6 F (36.4 C) (Temporal)   Ht 5\' 7"  (1.702 m)   Wt 277 lb 12.8 oz (126 kg)   SpO2 97%   BMI 43.51 kg/m    GEN: Well nourished, well developed, in no acute distress  Cardiac: RRR; no murmurs, rubs, or gallops,no edema -  Respiratory:  normal respiratory rate and pattern with no distress - normal breath sounds with no rales, rhonchi, wheezes or rubs Skin: warm and dry, no rash  Psych: euthymic mood, appropriate affect and demeanor  Assessment & Plan:    Benign hypertension Continue current med Recheck bp 1 month Follow up with cardiology as directed Anxiety Take buspar 5mg  bid    Follow-up: Return in about 4 months (around 02/24/2023) for chronic fasting follow-up - nurse visit bp check 1 month.  An After Visit Summary was printed and given to the patient.  Jettie Pagan [facility] [phone]

## 2022-10-29 ENCOUNTER — Other Ambulatory Visit: Payer: Self-pay | Admitting: Physician Assistant

## 2022-10-29 DIAGNOSIS — R0600 Dyspnea, unspecified: Secondary | ICD-10-CM

## 2022-11-02 ENCOUNTER — Encounter: Payer: Self-pay | Admitting: Physician Assistant

## 2022-11-02 ENCOUNTER — Ambulatory Visit (INDEPENDENT_AMBULATORY_CARE_PROVIDER_SITE_OTHER): Payer: 59 | Admitting: Physician Assistant

## 2022-11-02 VITALS — BP 120/68 | HR 78 | Temp 97.3°F | Resp 16 | Ht 67.0 in | Wt 278.0 lb

## 2022-11-02 DIAGNOSIS — M25561 Pain in right knee: Secondary | ICD-10-CM | POA: Diagnosis not present

## 2022-11-02 NOTE — Progress Notes (Signed)
Acute Office Visit  Subjective:    Patient ID: Linda Acevedo, female    DOB: [birth year], 67 y.o.   MRN: [id number]  Chief Complaint  Patient presents with   Fall    HPI: Patient is in today for complaints of right knee pain.  She was in [place] last week and fell in the hotel lobby landing on her right knee.  This is same knee she had replacement surgery on 5 years ago.  States she is having minimal pain but concerned because whole knee is bruised and has slight swelling on right patella Is using cane to walk No calf pain   Current Outpatient Medications:    albuterol (VENTOLIN HFA) 108 (90 Base) MCG/ACT inhaler, TAKE 2 PUFFS BY MOUTH EVERY 6 HOURS AS NEEDED FOR WHEEZE OR SHORTNESS OF BREATH, Disp: 8.5 each, Rfl: 1   busPIRone (BUSPAR) 5 MG tablet, Take 1 tablet (5 mg total) by mouth 2 (two) times daily., Disp: 180 tablet, Rfl: 1   clotrimazole-betamethasone (LOTRISONE) cream, Apply 1 Application topically 2 (two) times daily., Disp: 45 g, Rfl: 2   cyclobenzaprine (FLEXERIL) 10 MG tablet, Take by mouth., Disp: , Rfl:    DULoxetine (CYMBALTA) 60 MG capsule, TAKE 1 CAPSULE BY MOUTH EVERY DAY, Disp: 90 capsule, Rfl: 0   fluticasone (FLONASE) 50 MCG/ACT nasal spray, SPRAY 2 SPRAYS INTO EACH NOSTRIL EVERY DAY, Disp: 48 mL, Rfl: 1   furosemide (LASIX) 20 MG tablet, TAKE 1 TABLET BY MOUTH EVERY DAY AS DIRECTED, Disp: 90 tablet, Rfl: 1   lidocaine (LIDODERM) 5 %, 1 patch qd, Disp: 30 patch, Rfl: 1   losartan (COZAAR) 25 MG tablet, Take 1 tablet (25 mg total) by mouth daily., Disp: 90 tablet, Rfl: 3   meloxicam (MOBIC) 15 MG tablet, TAKE 1 TABLET (15 MG TOTAL) AT BEDTIME, Disp: 90 tablet, Rfl: 1   mupirocin ointment (BACTROBAN) 2 %, Apply 1 Application topically 2 (two) times daily., Disp: 22 g, Rfl: 0   omeprazole (PRILOSEC) 20 MG capsule, TAKE 1 CAPSULE BY MOUTH EVERY DAY, Disp: 90 capsule, Rfl: 0   pravastatin (PRAVACHOL) 40 MG tablet, Take 1 tablet (40 mg total) by mouth daily.,  Disp: 90 tablet, Rfl: 1   VITAMIN D PO, Take by mouth., Disp: , Rfl:   Allergies  Allergen Reactions   Gabapentin Swelling and Anaphylaxis    Face swelling    ROS CONSTITUTIONAL: Negative for chills, fatigue, fever,  CARDIOVASCULAR: Negative for chest pain, dizziness, RESPIRATORY: Negative for recent cough and dyspnea.   MSK: see HPI     Objective:    PHYSICAL EXAM:   BP 120/68   Pulse 78   Temp (!) 97.3 F (36.3 C)   Resp 16   Ht 5\' 7"  (1.702 m)   Wt 278 lb (126.1 kg)   BMI 43.54 kg/m    GEN: Well nourished, well developed, in no acute distress  Cardiac: RRR; no murmurs, Respiratory:  normal respiratory rate and pattern with no distress - normal breath sounds with no rales, rhonchi, wheezes or rubs MS: right knee with mild swelling on patella and bruising around knee noted - good rom Skin: warm and dry, no rash      Assessment & Plan:    Acute pain of right knee -     DG Knee 1-2 Views Right; Future   Recommend to keep leg elevated/ice therapy  Follow-up: Return if symptoms worsen or fail to improve.  An After Visit Summary was printed and  given to the patient.  Yetta Flock [facility] [phone]

## 2022-11-03 ENCOUNTER — Telehealth: Payer: Self-pay | Admitting: Emergency Medicine

## 2022-11-03 ENCOUNTER — Ambulatory Visit (HOSPITAL_COMMUNITY): Payer: 59 | Attending: Internal Medicine

## 2022-11-03 DIAGNOSIS — R0609 Other forms of dyspnea: Secondary | ICD-10-CM | POA: Diagnosis present

## 2022-11-03 LAB — ECHOCARDIOGRAM COMPLETE
Area-P 1/2: 2.68 cm2
P 1/2 time: 630 msec
S' Lateral: 3 cm

## 2022-11-03 MED ORDER — FUROSEMIDE 20 MG PO TABS: 20.0000 mg | ORAL_TABLET | Freq: Every day | ORAL | 3 refills | Status: DC

## 2022-11-03 NOTE — Telephone Encounter (Signed)
Maisie Fus, MD 11/03/2022 11:19 AM EDT Back to Top    Please let Mrs. Cordero know her echo showed normal heart function. The heart can get stiff overtime and she may have elevated pressures in the heart with activity. IF she still has SOB, can start lasix 20 mg daily   Called and spoke with the patient's daughter (dpr). Gave results and recommendations listed above. Sent in prescription for Lasix 20 mg daily.  We will notify her if any lab work is needed. She verbalized understanding.

## 2022-11-06 ENCOUNTER — Telehealth: Payer: Self-pay

## 2022-11-06 NOTE — Telephone Encounter (Signed)
Patients daughter informed

## 2022-11-06 NOTE — Telephone Encounter (Signed)
Patient's daughter called concerning patient's Knee that she came into see you for last week. She states that today she noticed that she patient's knee was hard and warm to the touch. She states that she remembers you told her to be looking for infection, and she is asking if there is a time frame she needs to be looking for these kinds of things? Please advise.

## 2022-11-08 ENCOUNTER — Other Ambulatory Visit: Payer: Self-pay

## 2022-11-13 ENCOUNTER — Other Ambulatory Visit: Payer: Self-pay

## 2022-11-13 MED ORDER — OMEPRAZOLE 20 MG PO CPDR: DELAYED_RELEASE_CAPSULE | ORAL | 0 refills | Status: DC

## 2022-11-22 ENCOUNTER — Ambulatory Visit: Payer: 59

## 2022-11-22 VITALS — BP 118/78

## 2022-11-22 DIAGNOSIS — I10 Essential (primary) hypertension: Secondary | ICD-10-CM

## 2022-11-22 NOTE — Progress Notes (Addendum)
Patient's Blood pressure was good today at 118/78, and states she is doing well and no complaints today. Told patient to follow up as scheduled.

## 2022-12-20 ENCOUNTER — Encounter: Payer: Self-pay | Admitting: Physician Assistant

## 2022-12-20 LAB — HM DEXA SCAN

## 2023-01-04 ENCOUNTER — Telehealth: Payer: Self-pay

## 2023-01-04 ENCOUNTER — Encounter: Payer: Self-pay | Admitting: Internal Medicine

## 2023-01-04 NOTE — Telephone Encounter (Signed)
Linda Acevedo called this afternoon stating that Linda Acevedo has a blood pressure of 113/87 and a high heart rate 130. Linda Acevedo denies Linda Acevedo having any chest pain, shortness of breath, HA or blurred vision. Glucose: "normal amount"-unknown number. Positive for dizziness/lightheaded.  I spoke with Linda Batten, LPN about this patient, it was recommended for the patient to contact her cardiologist. Linda Acevedo has been notified.

## 2023-01-05 NOTE — Telephone Encounter (Signed)
Agree with plan 

## 2023-01-25 ENCOUNTER — Other Ambulatory Visit: Payer: Self-pay | Admitting: Physician Assistant

## 2023-01-29 ENCOUNTER — Other Ambulatory Visit: Payer: Self-pay | Admitting: Physician Assistant

## 2023-01-29 ENCOUNTER — Other Ambulatory Visit: Payer: Self-pay | Admitting: Thoracic Surgery (Cardiothoracic Vascular Surgery)

## 2023-01-29 DIAGNOSIS — R0600 Dyspnea, unspecified: Secondary | ICD-10-CM

## 2023-01-29 DIAGNOSIS — I7121 Aneurysm of the ascending aorta, without rupture: Secondary | ICD-10-CM

## 2023-02-11 ENCOUNTER — Ambulatory Visit
Admission: RE | Admit: 2023-02-11 | Discharge: 2023-02-11 | Disposition: A | Discharge status: Home / Self Care | Payer: 59 | Source: Ambulatory Visit | Attending: Thoracic Surgery (Cardiothoracic Vascular Surgery) | Admitting: Thoracic Surgery (Cardiothoracic Vascular Surgery)

## 2023-02-11 DIAGNOSIS — I7121 Aneurysm of the ascending aorta, without rupture: Secondary | ICD-10-CM

## 2023-02-11 MED ORDER — GADOPICLENOL 0.5 MMOL/ML IV SOLN
10.0000 mL | Freq: Once | INTRAVENOUS | Status: AC | PRN
  Administered 2023-02-11: 10 mL via INTRAVENOUS

## 2023-02-15 ENCOUNTER — Telehealth: Payer: Self-pay

## 2023-02-15 NOTE — Telephone Encounter (Signed)
Patient daughter called and stated that patient is complaining of leg pain, denies any swelling. Pain I sleeping her from sleeping at night.   After looking in patient chart I was able to give patient daughter information to orthopedics, recommend she call them and let tham know, since she was referral to them about her back pain, and they have gave her shots in the past for her back.

## 2023-02-16 ENCOUNTER — Other Ambulatory Visit: Payer: Self-pay

## 2023-02-16 MED ORDER — FUROSEMIDE 20 MG PO TABS: 20.0000 mg | ORAL_TABLET | Freq: Every day | ORAL | 2 refills | Status: DC

## 2023-02-19 ENCOUNTER — Encounter: Payer: Self-pay | Admitting: Internal Medicine

## 2023-02-26 ENCOUNTER — Other Ambulatory Visit: Payer: Self-pay | Admitting: Family Medicine

## 2023-02-26 ENCOUNTER — Other Ambulatory Visit: Payer: Self-pay | Admitting: Physician Assistant

## 2023-02-26 DIAGNOSIS — M51369 Other intervertebral disc degeneration, lumbar region without mention of lumbar back pain or lower extremity pain: Secondary | ICD-10-CM

## 2023-02-26 DIAGNOSIS — R0600 Dyspnea, unspecified: Secondary | ICD-10-CM

## 2023-03-08 ENCOUNTER — Other Ambulatory Visit: Payer: Self-pay | Admitting: Physician Assistant

## 2023-03-10 ENCOUNTER — Other Ambulatory Visit: Payer: Self-pay | Admitting: Physician Assistant

## 2023-03-12 ENCOUNTER — Other Ambulatory Visit: Payer: Self-pay | Admitting: Physician Assistant

## 2023-03-12 DIAGNOSIS — F419 Anxiety disorder, unspecified: Secondary | ICD-10-CM

## 2023-03-12 NOTE — Patient Instructions (Addendum)
Haga todo lo posible para mantener un estilo de vida "saludable para el corazn" con ejercicio fsico regular y una dieta baja en grasas y carbohidratos.  Contine buscando citas de seguimiento peridicas con su mdico de atencin primaria y/o cardilogo.

## 2023-03-12 NOTE — Progress Notes (Signed)
[address]       [place] [address]             [phone]        Dalphine Demarr [id number] [birth year]  History of Present Illness:  Linda Acevedo is a 67 yo female with history of morbid obesity, HTN, OSA, arthritis, anxiety, DJD, GERD, and known Ascending Aortic Aneurysm.  She presents today for 1 year surveillance follow up.  Her aneurysm has been stable for the past 5 years measuring 3.8 cm.  She is no longer taking blood pressure medication as this has been making her feel poorly.  She has been having episodes of dizziness, nausea, and shortness of breath.  She has also suffered some fall previously and her daughter notes that she has slowed down since these occurred.  She is currently in the process of getting a new cardiologist as they did not care for the one they were referred to and she is due to get established at [facility].  She has been experiencing some elevated HR and her PCP recommended ZIO patch and has made the referral.    Current Outpatient Medications on File Prior to Visit  Medication Sig Dispense Refill   albuterol (VENTOLIN HFA) 108 (90 Base) MCG/ACT inhaler USE 2 INHALATIONS BY MOUTH EVERY 6 HOURS AS NEEDED FOR WHEEZE OR  SHORTNESS OF BREATH 36 g 6   busPIRone (BUSPAR) 5 MG tablet Take 1 tablet (5 mg total) by mouth 2 (two) times daily. 180 tablet 1   clotrimazole-betamethasone (LOTRISONE) cream APPLY TOPICALLY TO AFFECTED  AREA(S) TWICE DAILY 135 g 1   cyclobenzaprine (FLEXERIL) 10 MG tablet Take by mouth.     DULoxetine (CYMBALTA) 60 MG capsule TAKE 1 CAPSULE BY MOUTH EVERY DAY 90 capsule 0   fluticasone (FLONASE) 50 MCG/ACT nasal spray USE 2 SPRAYS IN BOTH NOSTRILS  DAILY 48 g 2   furosemide (LASIX) 20 MG tablet Take 1 tablet (20 mg total) by mouth daily. May take 1 tablet daily for edema and shortness of breath. 90 tablet 2   lidocaine (LIDODERM) 5 % APPLY 1 PATCH TOPICALLY TO SKIN  DAILY MAY BE LEFT ON UP TO 12  HOURS  WITHIN A 24 HOUR PERIOD 60 patch 0   losartan (COZAAR) 25 MG tablet Take 1 tablet (25 mg total) by mouth daily. 90 tablet 3   meloxicam (MOBIC) 15 MG tablet TAKE 1 TABLET BY MOUTH AT  BEDTIME 100 tablet 0   mupirocin ointment (BACTROBAN) 2 % Apply 1 Application topically 2 (two) times daily. 22 g 0   omeprazole (PRILOSEC) 20 MG capsule TAKE 1 CAPSULE BY MOUTH DAILY 90 capsule 3   pravastatin (PRAVACHOL) 40 MG tablet Take 1 tablet (40 mg total) by mouth daily. 90 tablet 1   VITAMIN D PO Take by mouth.     No current facility-administered medications on file prior to visit.     There were no vitals taken for this visit.  Physical Exam  BP 123/80   Pulse 80   Resp 20   Ht 5\' 7"  (1.702 m)   Wt 273 lb (123.8 kg)   SpO2 97% Comment: RA  BMI 42.76 kg/m   Gen: NAD Heart:RRR Lungs: CTA bilaterally Ext: trace edema Neuro: intact  MRA Results:  VASCULAR   Aorta: Conventional 3 vessel arch anatomy. The aortic root remains stable in size. No evidence of dissection. On today's examination, the thoracic aorta is within normal  limits measuring no more than 3.8 cm in maximal transverse diameter.   Heart: Cardiomegaly.   Pulmonary Arteries: Within normal limits. No evidence of central pulmonary embolism.   Other: None.   NON-VASCULAR   No focal signal abnormality or abnormal enhancement within the mediastinum, lungs, pleura, visualized upper abdomen or musculoskeletal soft tissues.   IMPRESSION: 1. On today's examination, the ascending thoracic aorta is within normal limits in size at 3.8 cm in maximal transverse diameter. This is unchanged according to my measurements across multiple prior studies dating back to October of 2019. Prior slightly larger measurements of 4-4.1 cm appear to have been slightly exaggerated by underlying cardiac motion artifact. It is highly likely that the patient's aorta is within normal limits given stability over 5 years. 2. Stable  cardiomegaly.   Signed,   Sterling Big, MD, RPVI   [facility]   [facility]     Electronically Signed   By: Malachy Moan M.D.   On: 02/11/2023 11:30    A/P:  Ascending Aortic Aneurysm- this remains stable at 3.8 cm-- this is considered normal and has remained stable over the past 5 imaging studies- this does not require further surveillance HTN- after review of patient's chart her BP has consistently been less than 140.  She feels poorly with use of BP medications, I suspect her BP is going too low with this.  Being she does not have an Aortic Aneurysm I think a SBP of 140 is acceptable.. patient and daughter are monitoring her blood pressure very closely Morbid Obesity OSA  RTC prn    Lowella Dandy, PA-C 03/12/23

## 2023-03-16 ENCOUNTER — Encounter: Payer: Self-pay | Admitting: Physician Assistant

## 2023-03-16 ENCOUNTER — Ambulatory Visit (INDEPENDENT_AMBULATORY_CARE_PROVIDER_SITE_OTHER): Payer: 59 | Admitting: Physician Assistant

## 2023-03-16 VITALS — BP 104/80 | HR 56 | Temp 96.9°F | Ht 67.0 in | Wt 273.2 lb

## 2023-03-16 DIAGNOSIS — I10 Essential (primary) hypertension: Secondary | ICD-10-CM

## 2023-03-16 DIAGNOSIS — E782 Mixed hyperlipidemia: Secondary | ICD-10-CM

## 2023-03-16 DIAGNOSIS — R7303 Prediabetes: Secondary | ICD-10-CM

## 2023-03-16 DIAGNOSIS — R42 Dizziness and giddiness: Secondary | ICD-10-CM

## 2023-03-16 DIAGNOSIS — M51362 Other intervertebral disc degeneration, lumbar region with discogenic back pain and lower extremity pain: Secondary | ICD-10-CM | POA: Diagnosis not present

## 2023-03-16 DIAGNOSIS — I951 Orthostatic hypotension: Secondary | ICD-10-CM

## 2023-03-16 DIAGNOSIS — F419 Anxiety disorder, unspecified: Secondary | ICD-10-CM

## 2023-03-16 DIAGNOSIS — H539 Unspecified visual disturbance: Secondary | ICD-10-CM

## 2023-03-16 DIAGNOSIS — E559 Vitamin D deficiency, unspecified: Secondary | ICD-10-CM

## 2023-03-16 MED ORDER — MECLIZINE HCL 12.5 MG PO TABS: 12.5000 mg | ORAL_TABLET | Freq: Two times a day (BID) | ORAL | 0 refills | Status: AC | PRN

## 2023-03-16 MED ORDER — ONDANSETRON HCL 4 MG PO TABS: 4.0000 mg | ORAL_TABLET | Freq: Three times a day (TID) | ORAL | 0 refills | Status: AC | PRN

## 2023-03-16 NOTE — Progress Notes (Signed)
Subjective:  Patient ID: Linda Acevedo, female    DOB: 23-Aug-1955  Age: 67 y.o. MRN: [id number]  Chief Complaint  Patient presents with   Medical Management of Chronic Issues   Visit translated by daughter and cma Cristal Generous Hyperlipidemia    Mixed hyperlipidemia  Pt presents with hyperlipidemia.  Compliance with treatment has been good The patient is compliant with medications, maintains a low cholesterol diet , follows up as directed , and maintains an exercise regimen . The patient denies experiencing any hypercholesterolemia related symptoms. Currenlty on pravachol 40mg  qd  Pt with history of chronic low back pain - had been seeing [facility] for this issue - states symptoms are stable at this time and she is currently taking flexeril, mobic and uses lidoderm patch  Pt with history of intermittent lower extremity edema - uses lasix 20mg    Pt with history of GERD - stable on prilosec 20mg  qd  Pt with history of anxiety - stable on cymbalta 60mg  and buspar 5mg   Pt with history of vit D def - taking otc meds because rx was too expensive - due for labwork  Pt states since August she has been having trouble with intermittent dizziness, nausea and malaise.  Had been feeling better the past month until 2 days ago and symptoms have recurred She denies chest pain / dyspnea/edema/chest pressure She had a cardiology evaluation earlier in year -- normal stress test 6/24 and normal echo with EF 65% 6/24 also Daughter states her blood pressure had been fluctuating a bit and they actually stopped her bp med about a month ago and tried to get guidance from cardiology but feel it was difficult to get answers etc With her new onset of dizziness and orthostatic hypotension I feel she at least needs a Zio patch and follow up with cardiology and they request a new provider She complains of pain in her upper back and neck that causes tingling also in scalp - when she feels dizzy she  states her vision gets slightly more blurry and possibly slight double vision She has no symptoms today Current Outpatient Medications on File Prior to Visit  Medication Sig Dispense Refill   albuterol (VENTOLIN HFA) 108 (90 Base) MCG/ACT inhaler USE 2 INHALATIONS BY MOUTH EVERY 6 HOURS AS NEEDED FOR WHEEZE OR  SHORTNESS OF BREATH 36 g 6   busPIRone (BUSPAR) 5 MG tablet TAKE 1 TABLET BY MOUTH TWICE  DAILY 200 tablet 0   clotrimazole-betamethasone (LOTRISONE) cream APPLY TOPICALLY TO AFFECTED  AREA(S) TWICE DAILY 135 g 1   cyclobenzaprine (FLEXERIL) 10 MG tablet Take by mouth.     DULoxetine (CYMBALTA) 60 MG capsule TAKE 1 CAPSULE BY MOUTH EVERY DAY 90 capsule 0   fluticasone (FLONASE) 50 MCG/ACT nasal spray USE 2 SPRAYS IN BOTH NOSTRILS  DAILY 48 g 2   furosemide (LASIX) 20 MG tablet Take 1 tablet (20 mg total) by mouth daily. May take 1 tablet daily for edema and shortness of breath. 90 tablet 2   lidocaine (LIDODERM) 5 % APPLY 1 PATCH TOPICALLY TO SKIN  DAILY MAY BE LEFT ON UP TO 12  HOURS WITHIN A 24 HOUR PERIOD 60 patch 0   meloxicam (MOBIC) 15 MG tablet TAKE 1 TABLET BY MOUTH AT  BEDTIME 100 tablet 0   mupirocin ointment (BACTROBAN) 2 % Apply 1 Application topically 2 (two) times daily. 22 g 0   omeprazole (PRILOSEC) 20 MG capsule TAKE 1 CAPSULE BY MOUTH DAILY 90 capsule  3   pravastatin (PRAVACHOL) 40 MG tablet TAKE 1 TABLET BY MOUTH DAILY 100 tablet 2   VITAMIN D PO Take by mouth.     No current facility-administered medications on file prior to visit.   Past Medical History:  Diagnosis Date   Anxiety    Arthritis    Cataract    Degenerative disc disease    Depression    GERD (gastroesophageal reflux disease)    Headache 10/08/2018   Hyperlipidemia    Hypertension    Morbid obesity (HCC)    Sleep apnea    uses a c-pap   Past Surgical History:  Procedure Laterality Date   ABLATION SAPHENOUS VEIN W/ RFA     Bil   COLONOSCOPY     JOINT REPLACEMENT     knee/right     Family History  Problem Relation Age of Onset   Esophageal cancer Father        died at 4   Cancer Father    Colon cancer Sister 90       died at 65   Colon polyps Neg Hx    Rectal cancer Neg Hx    Stomach cancer Neg Hx    Social History   Socioeconomic History   Marital status: Divorced    Spouse name: Not on file   Number of children: 2   Years of education: Not on file   Highest education level: 3rd grade  Occupational History   Occupation: homemaker  Tobacco Use   Smoking status: Never   Smokeless tobacco: Never  Vaping Use   Vaping status: Never Used  Substance and Sexual Activity   Alcohol use: Yes    Comment: very rare that she has a drink   Drug use: No   Sexual activity: Never    Partners: Male  Other Topics Concern   Not on file  Social History Narrative   Lives with daughter [name]   Right handed    2 cups of coffee daily    Social Determinants of Health   Financial Resource Strain: Low Risk  (09/19/2022)   Overall Financial Resource Strain (CARDIA)    Difficulty of Paying Living Expenses: Not hard at all  Food Insecurity: No Food Insecurity (09/19/2022)   Hunger Vital Sign    Worried About Running Out of Food in the Last Year: Never true    Ran Out of Food in the Last Year: Never true  Transportation Needs: No Transportation Needs (09/19/2022)   PRAPARE - Administrator, Civil Service (Medical): No    Lack of Transportation (Non-Medical): No  Physical Activity: Inactive (09/19/2022)   Exercise Vital Sign    Days of Exercise per Week: 0 days    Minutes of Exercise per Session: 0 min  Stress: Stress Concern Present (09/19/2022)   [employer] of Occupational Health - Occupational Stress Questionnaire    Feeling of Stress : Very much  Social Connections: Socially Isolated (09/19/2022)   Social Connection and Isolation Panel [NHANES]    Frequency of Communication with Friends and Family: More than three times a week    Frequency of  Social Gatherings with Friends and Family: Never    Attends Religious Services: Never    Database administrator or Organizations: No    Attends Engineer, structural: Not on file    Marital Status: Divorced  CONSTITUTIONAL: see HPI E/N/T: Negative for ear pain, nasal congestion and sore throat.  CARDIOVASCULAR: Negative for chest pain,  dizziness, palpitations and pedal edema.  RESPIRATORY: Negative for recent cough and dyspnea.  GASTROINTESTINAL: see HPI MSK: see HPI INTEGUMENTARY: Negative for rash.  NEUROLOGICAL: see HPI PSYCHIATRIC: Negative for sleep disturbance and to question depression screen.  Negative for depression, negative for anhedonia.      Objective:  PHYSICAL EXAM:   VS: BP 104/80 (BP Location: Left Arm, Patient Position: Sitting, Cuff Size: Large)   Pulse (!) 56   Temp (!) 96.9 F (36.1 C) (Temporal)   Ht 5\' 7"  (1.702 m)   Wt 273 lb 3.2 oz (123.9 kg)   SpO2 92%   BMI 42.79 kg/m  Orthostatic Vitals for the past 48 hrs (Last 6 readings):  Patient Position Orthostatic BP Orthostatic Pulse BP Pulse BP Location Cuff Size  03/16/23 0852 Sitting -- -- 104/80 (!) 56 Left Arm Large  03/16/23 0953 Supine 110/80 68 -- -- Left Arm Large  03/16/23 0954 Sitting 102/78 87 -- -- Left Arm Large  03/16/23 0955 Sitting (!) 80/50 95 -- -- Left Arm Large    GEN: Well nourished, well developed, in no acute distress  HEENT: normal external ears and nose - normal external auditory canals and TMS - hearing grossly normal - - Lips, Teeth and Gums - normal  Oropharynx - normal mucosa, palate, and posterior pharynx Neck: no JVD or masses - no thyromegaly Cardiac: RRR; no murmurs, rubs, or gallops,no edema -  Respiratory:  normal respiratory rate and pattern with no distress - normal breath sounds with no rales, rhonchi, wheezes or rubs Skin: warm and dry, no rash  Neuro:  Alert and Oriented x 3,  - CN II-Xii grossly intact Psych: euthymic mood, appropriate affect and  demeanor  EKG no acute changes Lab Results  Component Value Date   WBC 3.9 08/21/2022   HGB 14.4 08/21/2022   HCT 43.3 08/21/2022   PLT 203 08/21/2022   GLUCOSE 92 10/20/2022   CHOL 185 08/21/2022   TRIG 72 08/21/2022   HDL 71 08/21/2022   LDLCALC 101 (H) 08/21/2022   ALT 20 08/21/2022   AST 21 08/21/2022   NA 140 10/20/2022   K 4.5 10/20/2022   CL 106 10/20/2022   CREATININE 0.71 10/20/2022   BUN 13 10/20/2022   CO2 25 10/20/2022   TSH 2.290 08/21/2022   HGBA1C 5.8 (H) 08/21/2022      Assessment & Plan:   Problem List Items Addressed This Visit       Musculoskeletal and Integument   Lumbar degenerative disc disease -  Continue meds as directed     Other   Anxiety   Relevant Orders   TSH Continue cymbalta and buspar   Vitamin D deficiency   Relevant Orders   VITAMIN D 25 Hydroxy (Vit-D Deficiency, Fractures) Continue otc vit D   Mixed hyperlipidemia   Relevant Orders   CBC with Differential/Platelet   Comprehensive metabolic panel   Lipid panel Continue meds - watch diet   Other Visit Diagnoses     Orthostatic hypotension Stay off bp medication Recommend hydration Refer back to cardiology (for this and dizziness - possible need Zio)      Nausea Rx for zofran  Dizziness Rx for antiver 12.5mg  bid prn  Vision changes MRI brain        .  Meds ordered this encounter  Medications   ondansetron (ZOFRAN) 4 MG tablet    Sig: Take 1 tablet (4 mg total) by mouth every 8 (eight) hours as needed for nausea or vomiting.  Dispense:  20 tablet    Refill:  0    Order Specific Question:   Supervising Provider    Answer:   Corey Harold   meclizine (ANTIVERT) 12.5 MG tablet    Sig: Take 1 tablet (12.5 mg total) by mouth 2 (two) times daily as needed for dizziness.    Dispense:  60 tablet    Refill:  0    Order Specific Question:   Supervising Provider    AnswerCorey Harold    Orders Placed This Encounter  Procedures   MR  Brain W Wo Contrast   CBC with Differential/Platelet   Comprehensive metabolic panel   TSH   Lipid panel   VITAMIN D 25 Hydroxy (Vit-D Deficiency, Fractures)   Hemoglobin A1c   Ambulatory referral to Cardiology   EKG 12-Lead     Follow-up: No follow-ups on file.  An After Visit Summary was printed and given to the patient.  Jettie Pagan [facility] [phone]

## 2023-03-17 LAB — CBC WITH DIFFERENTIAL/PLATELET
Basophils Absolute: 0 10*3/uL (ref 0.0–0.2)
Basos: 1 %
EOS (ABSOLUTE): 0.1 10*3/uL (ref 0.0–0.4)
Eos: 2 %
Hematocrit: 49.8 % — ABNORMAL HIGH (ref 34.0–46.6)
Hemoglobin: 16.2 g/dL — ABNORMAL HIGH (ref 11.1–15.9)
Immature Grans (Abs): 0 10*3/uL (ref 0.0–0.1)
Immature Granulocytes: 0 %
Lymphocytes Absolute: 2.9 10*3/uL (ref 0.7–3.1)
Lymphs: 37 %
MCH: 30.4 pg (ref 26.6–33.0)
MCHC: 32.5 g/dL (ref 31.5–35.7)
MCV: 93 fL (ref 79–97)
Monocytes Absolute: 0.6 10*3/uL (ref 0.1–0.9)
Monocytes: 7 %
Neutrophils Absolute: 4.3 10*3/uL (ref 1.4–7.0)
Neutrophils: 53 %
Platelets: 247 10*3/uL (ref 150–450)
RBC: 5.33 x10E6/uL — ABNORMAL HIGH (ref 3.77–5.28)
RDW: 13.2 % (ref 11.7–15.4)
WBC: 8 10*3/uL (ref 3.4–10.8)

## 2023-03-17 LAB — COMPREHENSIVE METABOLIC PANEL
ALT: 23 [IU]/L (ref 0–32)
AST: 20 [IU]/L (ref 0–40)
Albumin: 4.2 g/dL (ref 3.9–4.9)
Alkaline Phosphatase: 92 [IU]/L (ref 44–121)
BUN/Creatinine Ratio: 19 (ref 12–28)
BUN: 18 mg/dL (ref 8–27)
Bilirubin Total: 0.6 mg/dL (ref 0.0–1.2)
CO2: 26 mmol/L (ref 20–29)
Calcium: 9.5 mg/dL (ref 8.7–10.3)
Chloride: 102 mmol/L (ref 96–106)
Creatinine, Ser: 0.94 mg/dL (ref 0.57–1.00)
Globulin, Total: 2.7 g/dL (ref 1.5–4.5)
Glucose: 103 mg/dL — ABNORMAL HIGH (ref 70–99)
Potassium: 4.8 mmol/L (ref 3.5–5.2)
Sodium: 142 mmol/L (ref 134–144)
Total Protein: 6.9 g/dL (ref 6.0–8.5)
eGFR: 67 mL/min/{1.73_m2} (ref >59)

## 2023-03-17 LAB — TSH: TSH: 2.42 u[IU]/mL (ref 0.450–4.500)

## 2023-03-17 LAB — HEMOGLOBIN A1C
Est. average glucose Bld gHb Est-mCnc: 117 mg/dL
Hgb A1c MFr Bld: 5.7 % — ABNORMAL HIGH (ref 4.8–5.6)

## 2023-03-17 LAB — LIPID PANEL
Chol/HDL Ratio: 3.2 ratio (ref 0.0–4.4)
Cholesterol, Total: 217 mg/dL — ABNORMAL HIGH (ref 100–199)
HDL: 67 mg/dL (ref >39)
LDL Chol Calc (NIH): 130 mg/dL — ABNORMAL HIGH (ref 0–99)
Triglycerides: 112 mg/dL (ref 0–149)
VLDL Cholesterol Cal: 20 mg/dL (ref 5–40)

## 2023-03-17 LAB — VITAMIN D 25 HYDROXY (VIT D DEFICIENCY, FRACTURES): Vit D, 25-Hydroxy: 39.6 ng/mL (ref 30.0–100.0)

## 2023-03-21 ENCOUNTER — Ambulatory Visit: Payer: 59

## 2023-03-21 VITALS — BP 123/80 | HR 80 | Resp 20 | Ht 67.0 in | Wt 273.0 lb

## 2023-03-21 DIAGNOSIS — Z09 Encounter for follow-up examination after completed treatment for conditions other than malignant neoplasm: Secondary | ICD-10-CM | POA: Diagnosis not present

## 2023-03-25 ENCOUNTER — Ambulatory Visit (HOSPITAL_COMMUNITY)
Admission: RE | Admit: 2023-03-25 | Discharge: 2023-03-25 | Disposition: A | Discharge status: Home / Self Care | Payer: 59 | Source: Ambulatory Visit | Attending: Physician Assistant | Admitting: Physician Assistant

## 2023-03-25 DIAGNOSIS — H539 Unspecified visual disturbance: Secondary | ICD-10-CM | POA: Insufficient documentation

## 2023-03-25 DIAGNOSIS — I951 Orthostatic hypotension: Secondary | ICD-10-CM | POA: Diagnosis present

## 2023-03-25 DIAGNOSIS — R42 Dizziness and giddiness: Secondary | ICD-10-CM | POA: Diagnosis present

## 2023-03-25 MED ORDER — GADOBUTROL 1 MMOL/ML IV SOLN
10.0000 mL | Freq: Once | INTRAVENOUS | Status: AC | PRN
Start: 2023-03-25 — End: 2023-03-25
  Administered 2023-03-25: 10 mL via INTRAVENOUS

## 2023-04-09 ENCOUNTER — Encounter: Payer: Self-pay | Admitting: Physician Assistant

## 2023-04-10 ENCOUNTER — Ambulatory Visit: Payer: 59 | Admitting: Internal Medicine

## 2023-04-30 ENCOUNTER — Encounter: Payer: Self-pay | Admitting: Physician Assistant

## 2023-04-30 ENCOUNTER — Ambulatory Visit (INDEPENDENT_AMBULATORY_CARE_PROVIDER_SITE_OTHER): Payer: 59 | Admitting: Physician Assistant

## 2023-04-30 VITALS — BP 110/80 | HR 72 | Temp 98.2°F | Ht 67.0 in | Wt 282.4 lb

## 2023-04-30 DIAGNOSIS — Z23 Encounter for immunization: Secondary | ICD-10-CM | POA: Insufficient documentation

## 2023-04-30 DIAGNOSIS — R42 Dizziness and giddiness: Secondary | ICD-10-CM | POA: Diagnosis not present

## 2023-04-30 DIAGNOSIS — F419 Anxiety disorder, unspecified: Secondary | ICD-10-CM

## 2023-04-30 NOTE — Progress Notes (Signed)
Subjective:  Patient ID: Linda Acevedo, female    DOB: 1956/01/27  Age: 67 y.o. MRN: [id number]  Chief Complaint  Patient presents with   Follow up    HPI Pt in for follow up of dizziness and palpitations - she did see cardiologist and have ZIO patch done - had one episode when she felt some symtpoms Does not have results back Overall feeling well today from that standpoint  Pt is having some breakthrough anxiety - she is taking cymbalta but has not been taking buspar twice daily Recommend to try to take as directed to help with feelings of being overwhelmed, stressed and anxious She is agreeable to that  Pt would like flu shot today     04/30/2023    2:18 PM 03/16/2023    9:05 AM 10/25/2022    8:24 AM 09/19/2022    9:39 AM 07/27/2022   12:26 PM  Depression screen PHQ 2/9  Decreased Interest 2 0 0 0 0  Down, Depressed, Hopeless 2 0 1 0 0  PHQ - 2 Score 4 0 1 0 0  Altered sleeping 1 1 0    Tired, decreased energy 1 1 0    Change in appetite 1 1 0    Feeling bad or failure about yourself  2 0 0    Trouble concentrating 0 0 0    Moving slowly or fidgety/restless 2 0 0    Suicidal thoughts 0 0 0    PHQ-9 Score 11 3 1     Difficult doing work/chores Somewhat difficult Not difficult at all Not difficult at all          07/27/2022   12:29 PM 08/21/2022    8:16 AM 10/25/2022    8:23 AM 03/16/2023    9:05 AM 04/30/2023    2:18 PM  Fall Risk  Falls in the past year? 1 1 0 1 1  Was there an injury with Fall? 1 0 0 1 1  Was there an injury with Fall? - Comments tripped and fell causing multiple bruises on her face      Fall Risk Category Calculator 2 1 0 3 3  Patient at Risk for Falls Due to History of fall(s) History of fall(s) No Fall Risks No Fall Risks;Impaired balance/gait   Fall risk Follow up Falls evaluation completed;Education provided Falls evaluation completed Falls evaluation completed       ROS CONSTITUTIONAL: Negative for chills, fatigue, fever, unintentional  weight gain and unintentional weight loss.   CARDIOVASCULAR: Negative for chest pain, dizziness, palpitations and pedal edema.  RESPIRATORY: Negative for recent cough and dyspnea.   NEUROLOGICAL: see HPI PSYCHIATRIC: see HPI   Current Outpatient Medications:    albuterol (VENTOLIN HFA) 108 (90 Base) MCG/ACT inhaler, USE 2 INHALATIONS BY MOUTH EVERY 6 HOURS AS NEEDED FOR WHEEZE OR  SHORTNESS OF BREATH (Patient taking differently: USE 2 INHALATIONS BY MOUTH EVERY 6 HOURS AS NEEDED FOR WHEEZE OR  SHORTNESS OF BREATH PRN), Disp: 36 g, Rfl: 6   busPIRone (BUSPAR) 5 MG tablet, TAKE 1 TABLET BY MOUTH TWICE  DAILY, Disp: 200 tablet, Rfl: 0   clotrimazole-betamethasone (LOTRISONE) cream, APPLY TOPICALLY TO AFFECTED  AREA(S) TWICE DAILY, Disp: 135 g, Rfl: 1   cyclobenzaprine (FLEXERIL) 10 MG tablet, Take by mouth., Disp: , Rfl:    DULoxetine (CYMBALTA) 60 MG capsule, TAKE 1 CAPSULE BY MOUTH EVERY DAY, Disp: 90 capsule, Rfl: 0   fluticasone (FLONASE) 50 MCG/ACT nasal spray, USE 2 SPRAYS IN  BOTH NOSTRILS  DAILY, Disp: 48 g, Rfl: 2   furosemide (LASIX) 20 MG tablet, Take 1 tablet (20 mg total) by mouth daily. May take 1 tablet daily for edema and shortness of breath., Disp: 90 tablet, Rfl: 2   lidocaine (LIDODERM) 5 %, APPLY 1 PATCH TOPICALLY TO SKIN  DAILY MAY BE LEFT ON UP TO 12  HOURS WITHIN A 24 HOUR PERIOD, Disp: 60 patch, Rfl: 0   meclizine (ANTIVERT) 12.5 MG tablet, Take 1 tablet (12.5 mg total) by mouth 2 (two) times daily as needed for dizziness., Disp: 60 tablet, Rfl: 0   meloxicam (MOBIC) 15 MG tablet, TAKE 1 TABLET BY MOUTH AT  BEDTIME, Disp: 100 tablet, Rfl: 0   omeprazole (PRILOSEC) 20 MG capsule, TAKE 1 CAPSULE BY MOUTH DAILY, Disp: 90 capsule, Rfl: 3   ondansetron (ZOFRAN) 4 MG tablet, Take 1 tablet (4 mg total) by mouth every 8 (eight) hours as needed for nausea or vomiting., Disp: 20 tablet, Rfl: 0   pravastatin (PRAVACHOL) 40 MG tablet, TAKE 1 TABLET BY MOUTH DAILY, Disp: 100 tablet, Rfl:  2   VITAMIN D PO, Take by mouth., Disp: , Rfl:    mupirocin ointment (BACTROBAN) 2 %, Apply 1 Application topically 2 (two) times daily. (Patient not taking: Reported on 04/30/2023), Disp: 22 g, Rfl: 0  Past Medical History:  Diagnosis Date   Anxiety    Arthritis    Cataract    Degenerative disc disease    Depression    GERD (gastroesophageal reflux disease)    Headache 10/08/2018   Hyperlipidemia    Hypertension    Morbid obesity (HCC)    Sleep apnea    uses a c-pap   Objective:  PHYSICAL EXAM:   BP 110/80   Pulse 72   Temp 98.2 F (36.8 C)   Ht 5\' 7"  (1.702 m)   Wt 282 lb 6.4 oz (128.1 kg)   SpO2 97%   BMI 44.23 kg/m    GEN: Well nourished, well developed, in no acute distress   Cardiac: RRR; no murmurs, rubs,  Respiratory:  normal respiratory rate and pattern with no distress - normal breath sounds with no rales, rhonchi, wheezes or rubs  Psych: euthymic mood, appropriate affect and demeanor  Assessment & Plan:    Dizziness Follow up with cardiology as directed Anxiety Increase buspar to bid Needs flu shot -     Flu Vaccine Trivalent High Dose (Fluad)     Follow-up: Return in about 4 months (around 08/29/2023) for chronic fasting follow-up - 40 min.  An After Visit Summary was printed and given to the patient.  Jettie Pagan [facility] [phone]

## 2023-05-08 ENCOUNTER — Other Ambulatory Visit: Payer: Self-pay | Admitting: Internal Medicine

## 2023-05-08 ENCOUNTER — Encounter: Payer: Self-pay | Admitting: Physician Assistant

## 2023-05-20 ENCOUNTER — Other Ambulatory Visit: Payer: Self-pay | Admitting: Family Medicine

## 2023-05-22 ENCOUNTER — Other Ambulatory Visit: Payer: Self-pay | Admitting: Family Medicine

## 2023-07-13 ENCOUNTER — Other Ambulatory Visit: Payer: Self-pay | Admitting: Physician Assistant

## 2023-07-13 ENCOUNTER — Other Ambulatory Visit: Payer: Self-pay | Admitting: Internal Medicine

## 2023-07-13 DIAGNOSIS — M51369 Other intervertebral disc degeneration, lumbar region without mention of lumbar back pain or lower extremity pain: Secondary | ICD-10-CM

## 2023-08-15 ENCOUNTER — Other Ambulatory Visit: Payer: Self-pay | Admitting: Physician Assistant

## 2023-08-15 DIAGNOSIS — M51369 Other intervertebral disc degeneration, lumbar region without mention of lumbar back pain or lower extremity pain: Secondary | ICD-10-CM

## 2023-08-20 ENCOUNTER — Telehealth: Payer: Self-pay

## 2023-08-20 NOTE — Telephone Encounter (Signed)
 I left a message on the number(s) listed in the patients chart requesting the patient to call back regarding the upcomming appointment for 08/29/2023. The provider is out of the office that day. The appointment has been canceled. Waiting for the patient to return the call.

## 2023-08-29 ENCOUNTER — Ambulatory Visit: Payer: 59 | Admitting: Physician Assistant

## 2023-09-14 ENCOUNTER — Encounter: Payer: Self-pay | Admitting: Physician Assistant

## 2023-09-18 ENCOUNTER — Other Ambulatory Visit: Payer: Self-pay | Admitting: Physician Assistant

## 2023-09-18 DIAGNOSIS — G4733 Obstructive sleep apnea (adult) (pediatric): Secondary | ICD-10-CM

## 2023-10-04 NOTE — Progress Notes (Signed)
 Linda Acevedo

## 2023-10-08 ENCOUNTER — Encounter: Payer: Self-pay | Admitting: Family Medicine

## 2023-10-08 ENCOUNTER — Ambulatory Visit: Admitting: Family Medicine

## 2023-10-08 VITALS — BP 130/78 | HR 82 | Ht 67.0 in | Wt 282.6 lb

## 2023-10-08 DIAGNOSIS — G4733 Obstructive sleep apnea (adult) (pediatric): Secondary | ICD-10-CM

## 2023-10-08 NOTE — Progress Notes (Signed)
 PATIENT: Linda Acevedo DOB: March 30, 1956  REASON FOR VISIT: follow up HISTORY FROM: patient  Chief Complaint  Patient presents with   Follow-up    RM 1. CPAP f/u. DME: [facility]. Feels too much air blowing out of mask. [facility] said they need order sent to them. Not currently using CPAP. Stopped beginning of April 2025.     HISTORY OF PRESENT ILLNESS:  10/08/23 ALL:  Linda Acevedo is a 69 y.o. female here today for follow up for OSA on CPAP.  She was previously doing very well. She stopped using therapy about a month ago due to nasal congestion and feeling pressure setting was uncomfortable. She is using FFM. There is a large air leak but has been better than previous reports. She denies any concerns of seasonal allergies but does have Flonase  prescribed by PCP. She denies obvious concerns with machine.     12/14/20 ALL (MyChart): Linda Acevedo is a 68 y.o. female here today for follow up for OSA on CPAP. She is doing better with compliance. She was last seen by Isa Manuel ordered a mask refitting and reeducation. She is doing better with compliance. Her daughter is her interpreter and assists with virtual televisit, today. She is using CPAP most every night. She continues to work on meeting 4 hour compliance. She denies concerns with mask or supplies. Her daughter knows that she did get a different mask but unsure if she has replaced it as advised. Lorian is feeling well and without complaints.         REVIEW OF SYSTEMS: Out of a complete 14 system review of symptoms, the patient complains only of the following symptoms, nasal congestion and all other reviewed systems are negative.  ESS: 10/24  ALLERGIES: Allergies  Allergen Reactions   Gabapentin Swelling and Anaphylaxis    Face swelling    HOME MEDICATIONS: Outpatient Medications Prior to Visit  Medication Sig Dispense Refill   albuterol  (VENTOLIN  HFA) 108 (90 Base) MCG/ACT inhaler USE 2 INHALATIONS BY MOUTH EVERY  6 HOURS AS NEEDED FOR WHEEZE OR  SHORTNESS OF BREATH (Patient taking differently: USE 2 INHALATIONS BY MOUTH EVERY 6 HOURS AS NEEDED FOR WHEEZE OR  SHORTNESS OF BREATH PRN) 36 g 6   busPIRone  (BUSPAR ) 5 MG tablet TAKE 1 TABLET BY MOUTH TWICE  DAILY 200 tablet 0   clotrimazole -betamethasone  (LOTRISONE ) cream APPLY TOPICALLY TO AFFECTED  AREA(S) TWICE DAILY 135 g 1   cyclobenzaprine (FLEXERIL) 10 MG tablet Take by mouth.     DULoxetine  (CYMBALTA ) 60 MG capsule TAKE 1 CAPSULE BY MOUTH DAILY 90 capsule 3   fluticasone  (FLONASE ) 50 MCG/ACT nasal spray USE 2 SPRAYS IN BOTH NOSTRILS  DAILY (Patient taking differently: Place 2 sprays into both nostrils as needed. USE 2 SPRAYS IN BOTH NOSTRILS  DAILY) 48 g 2   furosemide  (LASIX ) 20 MG tablet Take 1 tablet (20 mg total) by mouth daily. May take 1 tablet daily for edema and shortness of breath. 90 tablet 2   lidocaine  (LIDODERM ) 5 % APPLY 1 PATCH TOPICALLY TO SKIN  DAILY MAY BE LEFT ON UP TO 12  HOURS WITHIN A 24 HOUR PERIOD 30 patch 1   meclizine  (ANTIVERT ) 12.5 MG tablet Take 1 tablet (12.5 mg total) by mouth 2 (two) times daily as needed for dizziness. 60 tablet 0   meloxicam  (MOBIC ) 15 MG tablet TAKE 1 TABLET BY MOUTH AT  BEDTIME 100 tablet 2   mupirocin  ointment (BACTROBAN ) 2 % Apply 1 Application topically  2 (two) times daily. 22 g 0   omeprazole  (PRILOSEC) 20 MG capsule TAKE 1 CAPSULE BY MOUTH DAILY (Patient taking differently: Take 20 mg by mouth as needed. TAKE 1 CAPSULE BY MOUTH DAILY) 90 capsule 3   ondansetron  (ZOFRAN ) 4 MG tablet Take 1 tablet (4 mg total) by mouth every 8 (eight) hours as needed for nausea or vomiting. 20 tablet 0   pravastatin  (PRAVACHOL ) 40 MG tablet TAKE 1 TABLET BY MOUTH DAILY 100 tablet 2   VITAMIN D  PO Take by mouth.     No facility-administered medications prior to visit.    PAST MEDICAL HISTORY: Past Medical History:  Diagnosis Date   Anxiety    Arthritis    Cataract    Degenerative disc disease    Depression     GERD (gastroesophageal reflux disease)    Headache 10/08/2018   Hyperlipidemia    Hypertension    Morbid obesity (HCC)    Sleep apnea    uses a c-pap    PAST SURGICAL HISTORY: Past Surgical History:  Procedure Laterality Date   ABLATION SAPHENOUS VEIN W/ RFA     Bil   COLONOSCOPY     JOINT REPLACEMENT     knee/right    FAMILY HISTORY: Family History  Problem Relation Age of Onset   Esophageal cancer Father        died at 20   Cancer Father    Colon cancer Sister 72       died at 38   Colon polyps Neg Hx    Rectal cancer Neg Hx    Stomach cancer Neg Hx     SOCIAL HISTORY: Social History   Socioeconomic History   Marital status: Divorced    Spouse name: Not on file   Number of children: 2   Years of education: Not on file   Highest education level: 3rd grade  Occupational History   Occupation: homemaker  Tobacco Use   Smoking status: Never   Smokeless tobacco: Never  Vaping Use   Vaping status: Never Used  Substance and Sexual Activity   Alcohol use: Yes    Comment: very rare that she has a drink   Drug use: No   Sexual activity: Never    Partners: Male  Other Topics Concern   Not on file  Social History Narrative   Lives with daughter Adelina Adu   Right handed    2 cups of coffee daily    Social Drivers of Health   Financial Resource Strain: Low Risk  (09/19/2022)   Overall Financial Resource Strain (CARDIA)    Difficulty of Paying Living Expenses: Not hard at all  Food Insecurity: No Food Insecurity (09/19/2022)   Hunger Vital Sign    Worried About Running Out of Food in the Last Year: Never true    Ran Out of Food in the Last Year: Never true  Transportation Needs: No Transportation Needs (09/19/2022)   PRAPARE - Administrator, Civil Service (Medical): No    Lack of Transportation (Non-Medical): No  Physical Activity: Unknown (09/19/2022)   Exercise Vital Sign    Days of Exercise per Week: 0 days    Minutes of Exercise per Session: Not  on file  Recent Concern: Physical Activity - Inactive (09/19/2022)   Exercise Vital Sign    Days of Exercise per Week: 0 days    Minutes of Exercise per Session: 0 min  Stress: Stress Concern Present (09/19/2022)   [employer] of Occupational  Health - Occupational Stress Questionnaire    Feeling of Stress : Very much  Social Connections: Socially Isolated (09/19/2022)   Social Connection and Isolation Panel [NHANES]    Frequency of Communication with Friends and Family: More than three times a week    Frequency of Social Gatherings with Friends and Family: Never    Attends Religious Services: Never    Database administrator or Organizations: No    Attends Engineer, structural: Not on file    Marital Status: Divorced  Intimate Partner Violence: Not on file     PHYSICAL EXAM  Vitals:   10/08/23 1428  BP: 130/78  Pulse: 82  Weight: 282 lb 9.6 oz (128.2 kg)  Height: 5\' 7"  (1.702 m)   Body mass index is 44.26 kg/m.  Generalized: Well developed, in no acute distress  Cardiology: normal rate and rhythm, no murmur noted Respiratory: clear to auscultation bilaterally  Neurological examination  Mentation: Alert oriented to time, place, history taking. Follows all commands speech and language fluent Cranial nerve II-XII: Pupils were equal round reactive to light. Extraocular movements were full, visual field were full  Motor: The motor testing reveals 5 over 5 strength of all 4 extremities. Good symmetric motor tone is noted throughout.  Gait and station: Gait is normal.    DIAGNOSTIC DATA (LABS, IMAGING, TESTING) - I reviewed patient records, labs, notes, testing and imaging myself where available.      No data to display           Lab Results  Component Value Date   WBC 8.0 03/16/2023   HGB 16.2 (H) 03/16/2023   HCT 49.8 (H) 03/16/2023   MCV 93 03/16/2023   PLT 247 03/16/2023      Component Value Date/Time   NA 142 03/16/2023 1014   K 4.8 03/16/2023  1014   CL 102 03/16/2023 1014   CO2 26 03/16/2023 1014   GLUCOSE 103 (H) 03/16/2023 1014   GLUCOSE 107 (H) 11/23/2008 0445   BUN 18 03/16/2023 1014   CREATININE 0.94 03/16/2023 1014   CALCIUM 9.5 03/16/2023 1014   PROT 6.9 03/16/2023 1014   ALBUMIN 4.2 03/16/2023 1014   AST 20 03/16/2023 1014   ALT 23 03/16/2023 1014   ALKPHOS 92 03/16/2023 1014   BILITOT 0.6 03/16/2023 1014   GFRNONAA 79 02/03/2020 1135   GFRAA 91 02/03/2020 1135   Lab Results  Component Value Date   CHOL 217 (H) 03/16/2023   HDL 67 03/16/2023   LDLCALC 130 (H) 03/16/2023   TRIG 112 03/16/2023   CHOLHDL 3.2 03/16/2023   Lab Results  Component Value Date   HGBA1C 5.7 (H) 03/16/2023   No results found for: "VITAMINB12" Lab Results  Component Value Date   TSH 2.420 03/16/2023     ASSESSMENT AND PLAN 67 y.o. year old female  has a past medical history of Anxiety, Arthritis, Cataract, Degenerative disc disease, Depression, GERD (gastroesophageal reflux disease), Headache (10/08/2018), Hyperlipidemia, Hypertension, Morbid obesity (HCC), and Sleep apnea. here with     ICD-10-CM   1. OSA on CPAP  G47.33 For home use only DME continuous positive airway pressure (CPAP)    For home use only DME continuous positive airway pressure (CPAP)       [place] was previously doing well on CPAP therapy but recently notes more nasal congestion. Headaches have been worse since not using therapy. Compliance report reveals sub optimal use. AHI is well managed. We will lower min pressure  from 6 to 5. Ramp 4cmH20. I will ask DME to perform mask refitting due to air leak. She was encouraged to continue using CPAP nightly and for greater than 4 hours each night. We will update supply orders as indicated. Risks of untreated sleep apnea review and education materials provided. I will recheck download in 6-8 weeks to assess compliance and leak. Healthy lifestyle habits encouraged. She will follow up in 1 year, sooner if  needed. She verbalizes understanding and agreement with this plan.   Her daughter is her interpreter and assists with visit, today.  Orders Placed This Encounter  Procedures   For home use only DME continuous positive airway pressure (CPAP)    Mask refitting, having leak and nasal congestion with current full face mask. Heated Humidity with all supplies as needed    Length of Need:   Lifetime    Patient has OSA or probable OSA:   Yes    Is the patient currently using CPAP in the home:   Yes    Settings:   Other see comments    CPAP supplies needed:   Mask, headgear, cushions, filters, heated tubing and water chamber   For home use only DME continuous positive airway pressure (CPAP)    Please decrease minimum pressure to 5cmH20.    Length of Need:   Lifetime    Patient has OSA or probable OSA:   Yes    Is the patient currently using CPAP in the home:   Yes    Settings:   Other see comments    CPAP supplies needed:   Mask, headgear, cushions, filters, heated tubing and water chamber     No orders of the defined types were placed in this encounter.     Terrilyn Fick, FNP-C 10/08/2023, 3:13 PM [facility] [address], [address] [place], [place] [address] [phone]

## 2023-10-08 NOTE — Patient Instructions (Addendum)
 Please continue using your CPAP regularly. While your insurance requires that you use CPAP at least 4 hours each night on 70% of the nights, I recommend, that you not skip any nights and use it throughout the night if you can. Getting used to CPAP and staying with the treatment long term does take time and patience and discipline. Untreated obstructive sleep apnea when it is moderate to severe can have an adverse impact on cardiovascular health and raise her risk for heart disease, arrhythmias, hypertension, congestive heart failure, stroke and diabetes. Untreated obstructive sleep apnea causes sleep disruption, nonrestorative sleep, and sleep deprivation. This can have an impact on your day to day functioning and cause daytime sleepiness and impairment of cognitive function, memory loss, mood disturbance, and problems focussing. Using CPAP regularly can improve these symptoms.  We will update supply orders, today. I will ask DME to drop starting pressure to 5cmH20. Use Flonase  if needed about 30 minutes prior to CPAP use.   Follow up in 1 year

## 2023-10-08 NOTE — Progress Notes (Signed)
 Sent community message to [facility] that two orders placed.

## 2023-10-28 ENCOUNTER — Other Ambulatory Visit: Payer: Self-pay | Admitting: Physician Assistant

## 2023-10-28 DIAGNOSIS — M51369 Other intervertebral disc degeneration, lumbar region without mention of lumbar back pain or lower extremity pain: Secondary | ICD-10-CM

## 2023-11-13 ENCOUNTER — Ambulatory Visit: Admitting: Family Medicine

## 2023-11-26 ENCOUNTER — Telehealth: Payer: Self-pay | Admitting: Family Medicine

## 2023-11-26 NOTE — Telephone Encounter (Signed)
 Will you please attach 90 day report, please? TY!

## 2023-11-30 HISTORY — PX: CATARACT EXTRACTION W/ INTRAOCULAR LENS IMPLANT: SHX1309

## 2023-12-07 ENCOUNTER — Encounter: Payer: Self-pay | Admitting: Advanced Practice Midwife

## 2023-12-10 ENCOUNTER — Ambulatory Visit (INDEPENDENT_AMBULATORY_CARE_PROVIDER_SITE_OTHER): Admitting: Physician Assistant

## 2023-12-10 ENCOUNTER — Encounter: Payer: Self-pay | Admitting: Physician Assistant

## 2023-12-10 VITALS — BP 120/70 | HR 75 | Temp 97.4°F | Resp 16 | Ht 67.0 in | Wt 283.0 lb

## 2023-12-10 DIAGNOSIS — Z1231 Encounter for screening mammogram for malignant neoplasm of breast: Secondary | ICD-10-CM

## 2023-12-10 DIAGNOSIS — Z6841 Body Mass Index (BMI) 40.0 and over, adult: Secondary | ICD-10-CM

## 2023-12-10 DIAGNOSIS — E782 Mixed hyperlipidemia: Secondary | ICD-10-CM

## 2023-12-10 DIAGNOSIS — R7303 Prediabetes: Secondary | ICD-10-CM

## 2023-12-10 DIAGNOSIS — M25561 Pain in right knee: Secondary | ICD-10-CM

## 2023-12-10 DIAGNOSIS — M51362 Other intervertebral disc degeneration, lumbar region with discogenic back pain and lower extremity pain: Secondary | ICD-10-CM | POA: Diagnosis not present

## 2023-12-10 DIAGNOSIS — E559 Vitamin D deficiency, unspecified: Secondary | ICD-10-CM

## 2023-12-10 DIAGNOSIS — G8929 Other chronic pain: Secondary | ICD-10-CM

## 2023-12-10 DIAGNOSIS — F419 Anxiety disorder, unspecified: Secondary | ICD-10-CM

## 2023-12-10 NOTE — Progress Notes (Signed)
 Subjective:  Patient ID: Linda Acevedo, female    DOB: 1955-08-18  Age: 68 y.o. MRN: [id number]  Chief Complaint  Patient presents with   Medical Management of Chronic Issues    Hyperlipidemia    Mixed hyperlipidemia  Pt presents with hyperlipidemia.  Compliance with treatment has been good The patient is compliant with medications, maintains a low cholesterol diet , follows up as directed , and maintains an exercise regimen . The patient denies experiencing any hypercholesterolemia related symptoms. Currenlty on pravachol  40mg  qd  Pt with history of chronic low back pain - had been seeing [facility] for this issue - states symptoms are stable at this time and she is currently taking flexeril, mobic  and uses lidoderm  patch  Pt with history of intermittent lower extremity edema - uses lasix  20mg    Pt with history of GERD - stable on prilosec 20mg  qd  Pt with history of anxiety - stable on cymbalta  60mg  and buspar  5mg   Pt with history of vit D def - taking otc meds because rx was too expensive - due for labwork  Pt is due for mammogram - would like to schedule Current Outpatient Medications on File Prior to Visit  Medication Sig Dispense Refill   albuterol  (VENTOLIN  HFA) 108 (90 Base) MCG/ACT inhaler USE 2 INHALATIONS BY MOUTH EVERY 6 HOURS AS NEEDED FOR WHEEZE OR  SHORTNESS OF BREATH 36 g 6   busPIRone  (BUSPAR ) 5 MG tablet TAKE 1 TABLET BY MOUTH TWICE  DAILY (Patient taking differently: Take 5 mg by mouth daily.) 200 tablet 0   clotrimazole -betamethasone  (LOTRISONE ) cream APPLY TOPICALLY TO AFFECTED  AREA(S) TWICE DAILY 135 g 1   cyclobenzaprine (FLEXERIL) 10 MG tablet Take by mouth. (Patient taking differently: Take 10 mg by mouth daily as needed for muscle spasms.)     DULoxetine  (CYMBALTA ) 60 MG capsule TAKE 1 CAPSULE BY MOUTH DAILY 90 capsule 3   fluticasone  (FLONASE ) 50 MCG/ACT nasal spray USE 2 SPRAYS IN BOTH NOSTRILS  DAILY 48 g 2   furosemide  (LASIX ) 20 MG  tablet Take 1 tablet (20 mg total) by mouth daily. May take 1 tablet daily for edema and shortness of breath. (Patient taking differently: Take 20 mg by mouth daily as needed for edema. May take 1 tablet daily for edema and shortness of breath.) 90 tablet 2   lidocaine  (LIDODERM ) 5 % APPLY 1 PATCH TOPICALLY TO SKIN  DAILY MAY BE LEFT ON UP TO 12  HOURS WITHIN A 24 HOUR PERIOD 30 patch 1   meclizine  (ANTIVERT ) 12.5 MG tablet Take 1 tablet (12.5 mg total) by mouth 2 (two) times daily as needed for dizziness. 60 tablet 0   meloxicam  (MOBIC ) 15 MG tablet TAKE 1 TABLET BY MOUTH AT  BEDTIME 100 tablet 2   omeprazole  (PRILOSEC) 20 MG capsule TAKE 1 CAPSULE BY MOUTH DAILY (Patient taking differently: Take 20 mg by mouth daily as needed. TAKE 1 CAPSULE BY MOUTH DAILY) 90 capsule 3   ondansetron  (ZOFRAN ) 4 MG tablet Take 1 tablet (4 mg total) by mouth every 8 (eight) hours as needed for nausea or vomiting. 20 tablet 0   pravastatin  (PRAVACHOL ) 40 MG tablet TAKE 1 TABLET BY MOUTH DAILY 100 tablet 0   VITAMIN D  PO Take by mouth.     mupirocin  ointment (BACTROBAN ) 2 % Apply 1 Application topically 2 (two) times daily. 22 g 0   No current facility-administered medications on file prior to visit.   Past Medical History:  Diagnosis Date   Anxiety    Arthritis    Cataract    Degenerative disc disease    Depression    GERD (gastroesophageal reflux disease)    Headache 10/08/2018   Hyperlipidemia    Hypertension    Morbid obesity (HCC)    Sleep apnea    uses a c-pap   Past Surgical History:  Procedure Laterality Date   ABLATION SAPHENOUS VEIN W/ RFA     Bil   CATARACT EXTRACTION W/ INTRAOCULAR LENS IMPLANT Left 11/30/2023   COLONOSCOPY     JOINT REPLACEMENT     knee/right    Family History  Problem Relation Age of Onset   Esophageal cancer Father        died at 75   Cancer Father    Colon cancer Sister 30       died at 101   Colon polyps Neg Hx    Rectal cancer Neg Hx    Stomach cancer Neg  Hx    Social History   Socioeconomic History   Marital status: Divorced    Spouse name: Not on file   Number of children: 2   Years of education: Not on file   Highest education level: 3rd grade  Occupational History   Occupation: homemaker  Tobacco Use   Smoking status: Never   Smokeless tobacco: Never  Vaping Use   Vaping status: Never Used  Substance and Sexual Activity   Alcohol use: Yes    Comment: very rare that she has a drink   Drug use: No   Sexual activity: Not Currently    Partners: Male  Other Topics Concern   Not on file  Social History Narrative   Lives with daughter Mauro   Right handed    2 cups of coffee daily    Social Drivers of Health   Financial Resource Strain: Low Risk  (09/19/2022)   Overall Financial Resource Strain (CARDIA)    Difficulty of Paying Living Expenses: Not hard at all  Food Insecurity: No Food Insecurity (09/19/2022)   Hunger Vital Sign    Worried About Running Out of Food in the Last Year: Never true    Ran Out of Food in the Last Year: Never true  Transportation Needs: No Transportation Needs (09/19/2022)   PRAPARE - Administrator, Civil Service (Medical): No    Lack of Transportation (Non-Medical): No  Physical Activity: Unknown (09/19/2022)   Exercise Vital Sign    Days of Exercise per Week: 0 days    Minutes of Exercise per Session: Not on file  Recent Concern: Physical Activity - Inactive (09/19/2022)   Exercise Vital Sign    Days of Exercise per Week: 0 days    Minutes of Exercise per Session: 0 min  Stress: Stress Concern Present (09/19/2022)   [employer] of Occupational Health - Occupational Stress Questionnaire    Feeling of Stress : Very much  Social Connections: Socially Isolated (09/19/2022)   Social Connection and Isolation Panel    Frequency of Communication with Friends and Family: More than three times a week    Frequency of Social Gatherings with Friends and Family: Never    Attends  Religious Services: Never    Database administrator or Organizations: No    Attends Engineer, structural: Not on file    Marital Status: Divorced   CONSTITUTIONAL: Negative for chills, fatigue, fever, unintentional weight gain and unintentional weight loss.  E/N/T: Negative for ear  pain, nasal congestion and sore throat.  CARDIOVASCULAR: Negative for chest pain, dizziness, palpitations and pedal edema.  RESPIRATORY: Negative for recent cough and dyspnea.  GASTROINTESTINAL: Negative for abdominal pain, acid reflux symptoms, constipation, diarrhea, nausea and vomiting.  MSK: Negative for arthralgias and myalgias.  INTEGUMENTARY: Negative for rash.  NEUROLOGICAL: Negative for dizziness and headaches.  PSYCHIATRIC: Negative for sleep disturbance and to question depression screen.  Negative for depression, negative for anhedonia.      Objective:  PHYSICAL EXAM:   VS: BP 120/70   Pulse 75   Temp (!) 97.4 F (36.3 C)   Resp 16   Ht 5' 7 (1.702 m)   Wt 283 lb (128.4 kg)   SpO2 96%   BMI 44.32 kg/m   GEN: Well nourished, well developed, in no acute distress   Cardiac: RRR; no murmurs,  Respiratory:  normal respiratory rate and pattern with no distress - normal breath sounds with no rales, rhonchi, wheezes or rubs  MS: no deformity or atrophy  Skin: warm and dry, no rash  Neuro:  Alert and Oriented x 3,  - CN II-Xii grossly intact Psych: euthymic mood, appropriate affect and demeanor  Lab Results  Component Value Date   WBC 8.0 03/16/2023   HGB 16.2 (H) 03/16/2023   HCT 49.8 (H) 03/16/2023   PLT 247 03/16/2023   GLUCOSE 103 (H) 03/16/2023   CHOL 217 (H) 03/16/2023   TRIG 112 03/16/2023   HDL 67 03/16/2023   LDLCALC 130 (H) 03/16/2023   ALT 23 03/16/2023   AST 20 03/16/2023   NA 142 03/16/2023   K 4.8 03/16/2023   CL 102 03/16/2023   CREATININE 0.94 03/16/2023   BUN 18 03/16/2023   CO2 26 03/16/2023   TSH 2.420 03/16/2023   HGBA1C 5.7 (H) 03/16/2023       Assessment & Plan:   Problem List Items Addressed This Visit       Musculoskeletal and Integument   Lumbar degenerative disc disease -  Continue meds as directed     Other   Anxiety   Relevant Orders   TSH Continue cymbaltaand buspar    Vitamin D  deficiency   Relevant Orders   VITAMIN D  25 Hydroxy (Vit-D Deficiency, Fractures) Continue otc vit D   Mixed hyperlipidemia   Relevant Orders   CBC with Differential/Platelet   Comprehensive metabolic panel   Lipid panel Continue meds - watch diet   Other Visit Diagnoses     Gastroesophageal reflux disease with esophagitis without hemorrhage     Continue prilosec      Chronic pain right knee Rom exercises Continue meds as directed  Breast cancer screening Mammogram ordered  Morbid obesity (BMI 44) Efforts at diet/exercise/weight loss        .  No orders of the defined types were placed in this encounter.   Orders Placed This Encounter  Procedures   MM 3D SCREENING MAMMOGRAM BILATERAL BREAST   CBC with Differential/Platelet   Comprehensive metabolic panel with GFR   TSH   Lipid panel   Hemoglobin A1c   VITAMIN D  25 Hydroxy (Vit-D Deficiency, Fractures)     Follow-up: Return in about 4 months (around 04/11/2024) for chronic fasting follow-up - also schedule MCR well with Luke in next few months.  An After Visit Summary was printed and given to the patient.  CAMIE JONELLE NICHOLAUS DEVONNA [facility] [phone]

## 2023-12-11 ENCOUNTER — Ambulatory Visit: Payer: Self-pay | Admitting: Physician Assistant

## 2023-12-11 LAB — CBC WITH DIFFERENTIAL/PLATELET
Basophils Absolute: 0 x10E3/uL (ref 0.0–0.2)
Basos: 1 %
EOS (ABSOLUTE): 0.2 x10E3/uL (ref 0.0–0.4)
Eos: 3 %
Hematocrit: 44.8 % (ref 34.0–46.6)
Hemoglobin: 14.5 g/dL (ref 11.1–15.9)
Immature Grans (Abs): 0 x10E3/uL (ref 0.0–0.1)
Immature Granulocytes: 0 %
Lymphocytes Absolute: 2 x10E3/uL (ref 0.7–3.1)
Lymphs: 40 %
MCH: 30.9 pg (ref 26.6–33.0)
MCHC: 32.4 g/dL (ref 31.5–35.7)
MCV: 96 fL (ref 79–97)
Monocytes Absolute: 0.3 x10E3/uL (ref 0.1–0.9)
Monocytes: 6 %
Neutrophils Absolute: 2.5 x10E3/uL (ref 1.4–7.0)
Neutrophils: 50 %
Platelets: 208 x10E3/uL (ref 150–450)
RBC: 4.69 x10E6/uL (ref 3.77–5.28)
RDW: 12.7 % (ref 11.7–15.4)
WBC: 5 x10E3/uL (ref 3.4–10.8)

## 2023-12-11 LAB — COMPREHENSIVE METABOLIC PANEL WITH GFR
ALT: 18 IU/L (ref 0–32)
AST: 19 IU/L (ref 0–40)
Albumin: 4.1 g/dL (ref 3.9–4.9)
Alkaline Phosphatase: 98 IU/L (ref 44–121)
BUN/Creatinine Ratio: 24 (ref 12–28)
BUN: 18 mg/dL (ref 8–27)
Bilirubin Total: 0.5 mg/dL (ref 0.0–1.2)
CO2: 21 mmol/L (ref 20–29)
Calcium: 9.4 mg/dL (ref 8.7–10.3)
Chloride: 105 mmol/L (ref 96–106)
Creatinine, Ser: 0.76 mg/dL (ref 0.57–1.00)
Globulin, Total: 2.8 g/dL (ref 1.5–4.5)
Glucose: 102 mg/dL — ABNORMAL HIGH (ref 70–99)
Potassium: 4.6 mmol/L (ref 3.5–5.2)
Sodium: 142 mmol/L (ref 134–144)
Total Protein: 6.9 g/dL (ref 6.0–8.5)
eGFR: 85 mL/min/1.73 (ref >59)

## 2023-12-11 LAB — LIPID PANEL
Chol/HDL Ratio: 3.1 ratio (ref 0.0–4.4)
Cholesterol, Total: 190 mg/dL (ref 100–199)
HDL: 62 mg/dL (ref >39)
LDL Chol Calc (NIH): 113 mg/dL — ABNORMAL HIGH (ref 0–99)
Triglycerides: 80 mg/dL (ref 0–149)
VLDL Cholesterol Cal: 15 mg/dL (ref 5–40)

## 2023-12-11 LAB — TSH: TSH: 2.05 u[IU]/mL (ref 0.450–4.500)

## 2023-12-11 LAB — HEMOGLOBIN A1C
Est. average glucose Bld gHb Est-mCnc: 114 mg/dL
Hgb A1c MFr Bld: 5.6 % (ref 4.8–5.6)

## 2023-12-11 LAB — VITAMIN D 25 HYDROXY (VIT D DEFICIENCY, FRACTURES): Vit D, 25-Hydroxy: 47.8 ng/mL (ref 30.0–100.0)

## 2023-12-24 ENCOUNTER — Other Ambulatory Visit: Payer: Self-pay | Admitting: Physician Assistant

## 2023-12-24 ENCOUNTER — Other Ambulatory Visit: Payer: Self-pay | Admitting: Family Medicine

## 2023-12-24 DIAGNOSIS — M51369 Other intervertebral disc degeneration, lumbar region without mention of lumbar back pain or lower extremity pain: Secondary | ICD-10-CM

## 2023-12-24 DIAGNOSIS — F419 Anxiety disorder, unspecified: Secondary | ICD-10-CM

## 2023-12-25 ENCOUNTER — Other Ambulatory Visit: Payer: Self-pay

## 2023-12-25 MED ORDER — FUROSEMIDE 20 MG PO TABS: 20.0000 mg | ORAL_TABLET | Freq: Every day | ORAL | 0 refills | Status: DC

## 2024-01-21 ENCOUNTER — Other Ambulatory Visit: Payer: Self-pay | Admitting: Physician Assistant

## 2024-02-11 ENCOUNTER — Encounter: Payer: Self-pay | Admitting: Physician Assistant

## 2024-02-11 ENCOUNTER — Telehealth (INDEPENDENT_AMBULATORY_CARE_PROVIDER_SITE_OTHER)

## 2024-02-11 ENCOUNTER — Ambulatory Visit: Payer: Self-pay

## 2024-02-11 DIAGNOSIS — U071 COVID-19: Secondary | ICD-10-CM | POA: Diagnosis not present

## 2024-02-11 MED ORDER — NIRMATRELVIR/RITONAVIR (PAXLOVID)TABLET: 3.0000 | ORAL_TABLET | Freq: Two times a day (BID) | ORAL | 0 refills | Status: AC

## 2024-02-11 NOTE — Telephone Encounter (Signed)
 Pt has video visit scheduled with Dr Sirivol today

## 2024-02-11 NOTE — Telephone Encounter (Signed)
 Please call daughter and find out concerns As far as being COVID positive treat symptoms - tylenol , rest , fluids, otc meds for cough/congestion

## 2024-02-11 NOTE — Telephone Encounter (Signed)
 Called patient daughters and made her an appointment to do video visit with Dr. Sirivol

## 2024-02-11 NOTE — Telephone Encounter (Signed)
 FYI Only or Action Required?: Action required by provider: clinical question for provider.  Patient was last seen in primary care on 12/10/2023 by Nicholaus Credit, PA-C.  Called Nurse Triage reporting Covid Positive.  Symptoms began several days ago.  Interventions attempted: OTC medications: tylenol , ibuprofen, cold and flu.  Symptoms are: unchanged.  Triage Disposition: Call PCP When Office is Open  Patient/caregiver understands and will follow disposition?: No, wishes to speak with PCP   Copied from CRM #[id number]. Topic: Clinical - Red Word Triage >> Feb 11, 2024 11:56 AM Berwyn MATSU wrote: Red Word that prompted transfer to Nurse Triage:  itchy ears, sore throat, body aches, headaches, congestion, cold sweats   Covid positive 02/10/24 symptoms on Saturday Reason for Disposition  [1] COVID-19 infection suspected AND [2] mild symptoms (cough, fever, or others) AND [3] negative COVID-19 rapid test  Answer Assessment - Initial Assessment Questions No available appt today, Pt daughter states cannot bring pt in because she also has COVID and wants to speak with Ginnie about patient symptoms or get virtual visit.  Advised UC/ED if symptoms worsen  Patient's daughter reports: Covid home test positive as of yesterday.  1. SYMPTOMS: What is your main symptom or concern? (e.g., cough, fever, shortness of breath, muscle aches)     Headaches, congestion, runny nose, sweating, body aches, itchy ears. 2. ONSET: When did the symptoms start?      Saturday 3. COUGH: Do you have a cough? If Yes, ask: How bad is the cough?       Yes, coughing yellow phlegm, no blood 4. FEVER: Do you have a fever? If Yes, ask: What is your temperature, how was it measured, and when did it start?     Chills, unsure of fever; tylenol  and Ibuprofen, cold/flu severe, inhaler, tea 5. BREATHING DIFFICULTY: Are you having any difficulty breathing? (e.g., normal; shortness of breath, wheezing, unable to speak)       no 6. BETTER-SAME-WORSE: Are you getting better, staying the same or getting worse compared to yesterday?  If getting worse, ask, In what way?     No, haven't changed  8. COVID-19 DIAGNOSIS: How do you know that you have COVID? (e.g., positive lab test or self-test, diagnosed by doctor or NP/PA, symptoms after exposure).     Only home test 9. COVID-19 EXPOSURE: Was there any known exposure to COVID before the symptoms began?      Yes, daughter has it currently 10. COVID-19 VACCINE: Have you had the COVID-19 vaccine? If Yes, ask: When did you last get it?       2021 11. HIGH RISK DISEASE: Do you have any chronic medical problems? (e.g., asthma, heart or lung disease, weak immune system, obesity, etc.)     Denies, but reports uses albuterol  for sob as needed, not currently having sob  Protocols used: COVID-19 - Diagnosed or Suspected-A-AH

## 2024-02-11 NOTE — Progress Notes (Signed)
 Virtual Visit via Video Note   This visit type was conducted per patient request This format is felt to be appropriate for this patient at this time.  All issues noted in this document were discussed and addressed.  A limited physical exam was performed with this format.  A verbal consent was obtained for the virtual visit.   Date:  02/11/2024   ID:  Darolyn Double, DOB Dec 07, 1955, MRN [id number]  Patient Location: Home Provider Location: Office/Clinic  PCP:  Nicholaus Credit, PA-C   Chief Complaint  Patient presents with   Covid Positive   Headache   Nasal Congestion   Generalized Body Aches   Chills     History of Present Illness:    Discussed the use of AI scribe software for clinical note transcription with the patient, who gave verbal consent to proceed.  Discussed the use of AI scribe software for clinical note transcription with the patient, who gave verbal consent to proceed.  History of Present Illness   Desani Sprung is a 68 year old female who IS CONTACTED VIA VIDEO VISIT with COVID-19 symptoms. She is accompanied by her daughter [name], who assists with communication AS PATIENT IS SPANISH SPEAKING.   Acute respiratory symptoms - Day three of symptoms consistent with COVID-19 infection - Tested positive for COVID-19 - Mild respiratory symptoms present - No vomiting - No high fevers - Able to eat and drink without difficulty  Pulmonary history - Uses albuterol  inhaler as needed - No tobacco use  Metabolic status - Recent hemoglobin A1c of 5.6% - No history of kidney disease          Past Medical History:  Diagnosis Date   Anxiety    Arthritis    Cataract    Degenerative disc disease    Depression    GERD (gastroesophageal reflux disease)    Headache 10/08/2018   Hyperlipidemia    Hypertension    Morbid obesity (HCC)    Sleep apnea    uses a c-pap    Past Surgical History:  Procedure Laterality Date   ABLATION SAPHENOUS VEIN W/  RFA     Bil   CATARACT EXTRACTION W/ INTRAOCULAR LENS IMPLANT Left 11/30/2023   COLONOSCOPY     JOINT REPLACEMENT     knee/right    Family History  Problem Relation Age of Onset   Esophageal cancer Father        died at 8   Cancer Father    Colon cancer Sister 23       died at 64   Colon polyps Neg Hx    Rectal cancer Neg Hx    Stomach cancer Neg Hx     Social History   Socioeconomic History   Marital status: Divorced    Spouse name: Not on file   Number of children: 2   Years of education: Not on file   Highest education level: 3rd grade  Occupational History   Occupation: homemaker  Tobacco Use   Smoking status: Never   Smokeless tobacco: Never  Vaping Use   Vaping status: Never Used  Substance and Sexual Activity   Alcohol use: Yes    Comment: very rare that she has a drink   Drug use: No   Sexual activity: Not Currently    Partners: Male  Other Topics Concern   Not on file  Social History Narrative   Lives with daughter Mauro   Right handed    2 cups of  coffee daily    Social Drivers of Corporate investment banker Strain: Low Risk  (09/19/2022)   Overall Financial Resource Strain (CARDIA)    Difficulty of Paying Living Expenses: Not hard at all  Food Insecurity: No Food Insecurity (09/19/2022)   Hunger Vital Sign    Worried About Running Out of Food in the Last Year: Never true    Ran Out of Food in the Last Year: Never true  Transportation Needs: No Transportation Needs (09/19/2022)   PRAPARE - Administrator, Civil Service (Medical): No    Lack of Transportation (Non-Medical): No  Physical Activity: Unknown (09/19/2022)   Exercise Vital Sign    Days of Exercise per Week: 0 days    Minutes of Exercise per Session: Not on file  Recent Concern: Physical Activity - Inactive (09/19/2022)   Exercise Vital Sign    Days of Exercise per Week: 0 days    Minutes of Exercise per Session: 0 min  Stress: Stress Concern Present (09/19/2022)    [employer] of Occupational Health - Occupational Stress Questionnaire    Feeling of Stress : Very much  Social Connections: Socially Isolated (09/19/2022)   Social Connection and Isolation Panel    Frequency of Communication with Friends and Family: More than three times a week    Frequency of Social Gatherings with Friends and Family: Never    Attends Religious Services: Never    Database administrator or Organizations: No    Attends Engineer, structural: Not on file    Marital Status: Divorced  Catering manager Violence: Not on file    Outpatient Medications Prior to Visit  Medication Sig Dispense Refill   albuterol  (VENTOLIN  HFA) 108 (90 Base) MCG/ACT inhaler USE 2 INHALATIONS BY MOUTH EVERY 6 HOURS AS NEEDED FOR WHEEZE OR  SHORTNESS OF BREATH 36 g 6   busPIRone  (BUSPAR ) 5 MG tablet TAKE 1 TABLET BY MOUTH TWICE  DAILY 200 tablet 1   clotrimazole -betamethasone  (LOTRISONE ) cream APPLY TOPICALLY TO AFFECTED  AREA(S) TWICE DAILY 135 g 1   cyclobenzaprine (FLEXERIL) 10 MG tablet Take by mouth. (Patient taking differently: Take by mouth as needed.)     DULoxetine  (CYMBALTA ) 60 MG capsule TAKE 1 CAPSULE BY MOUTH DAILY 90 capsule 3   fluticasone  (FLONASE ) 50 MCG/ACT nasal spray USE 2 SPRAYS IN BOTH NOSTRILS  DAILY 64 g 1   furosemide  (LASIX ) 20 MG tablet Take 1 tablet (20 mg total) by mouth daily. May take 1 tablet daily for edema and shortness of breath. 30 tablet 0   lidocaine  (LIDODERM ) 5 % APPLY 1 PATCH TOPICALLY TO SKIN  DAILY MAY BE LEFT ON UP TO 12  HOURS WITHIN A 24 HOUR PERIOD 30 patch 1   meclizine  (ANTIVERT ) 12.5 MG tablet Take 1 tablet (12.5 mg total) by mouth 2 (two) times daily as needed for dizziness. 60 tablet 0   meloxicam  (MOBIC ) 15 MG tablet TAKE 1 TABLET BY MOUTH AT  BEDTIME 100 tablet 2   omeprazole  (PRILOSEC) 20 MG capsule TAKE 1 CAPSULE BY MOUTH DAILY (Patient taking differently: Take 20 mg by mouth daily as needed. TAKE 1 CAPSULE BY MOUTH DAILY) 90 capsule 3    ondansetron  (ZOFRAN ) 4 MG tablet Take 1 tablet (4 mg total) by mouth every 8 (eight) hours as needed for nausea or vomiting. 20 tablet 0   pravastatin  (PRAVACHOL ) 40 MG tablet TAKE 1 TABLET BY MOUTH DAILY 100 tablet 1   VITAMIN D  PO Take by  mouth.     No facility-administered medications prior to visit.    Allergies  Allergen Reactions   Gabapentin Swelling and Anaphylaxis    Face swelling     Social History   Tobacco Use   Smoking status: Never   Smokeless tobacco: Never  Vaping Use   Vaping status: Never Used  Substance Use Topics   Alcohol use: Yes    Comment: very rare that she has a drink   Drug use: No     Review of Systems  HENT:  Positive for congestion.   Respiratory:  Positive for cough.   Neurological:  Positive for headaches.     Labs/Other Tests and Data Reviewed:    Recent Labs: 12/10/2023: ALT 18; BUN 18; Creatinine, Ser 0.76; Hemoglobin 14.5; Platelets 208; Potassium 4.6; Sodium 142; TSH 2.050   Recent Lipid Panel Lab Results  Component Value Date/Time   CHOL 190 12/10/2023 09:00 AM   TRIG 80 12/10/2023 09:00 AM   HDL 62 12/10/2023 09:00 AM   CHOLHDL 3.1 12/10/2023 09:00 AM   LDLCALC 113 (H) 12/10/2023 09:00 AM    Wt Readings from Last 3 Encounters:  12/10/23 283 lb (128.4 kg)  10/08/23 282 lb 9.6 oz (128.2 kg)  04/30/23 282 lb 6.4 oz (128.1 kg)     Objective:    Vital Signs:  There were no vitals taken for this visit.   Physical Exam Vitals reviewed.  Constitutional:      General: She is not in acute distress.    Appearance: She is ill-appearing.  Neurological:     Mental Status: She is alert.      ASSESSMENT & PLAN:   COVID-19 virus infection Assessment & Plan: Day 3 of COVID-19 infection with persistent symptoms. No vomiting or high fevers. Kidneys functioning well. No significant risk factors for severe disease such as lung or heart disease. Mild borderline diabetes noted, not a significant risk factor for hospitalization. -  Prescribe Paxlovid  and send to pharmacy at normal dosing due to good/normal GFR - Instruct to hold Lipitor while on Paxlovid . - Advise supportive treatment with DayQuil, Nyquil, fluids, and steam. - Monitor for worsening symptoms and seek evaluation if condition deteriorates.   Other orders -     nirmatrelvir /ritonavir ; Take 3 tablets by mouth 2 (two) times daily for 5 days. (Take nirmatrelvir  150 mg two tablets twice daily for 5 days and ritonavir  100 mg one tablet twice daily for 5 days) Patient GFR is 84  Dispense: 30 tablet; Refill: 0    Assessment and Plan           No orders of the defined types were placed in this encounter.    Meds ordered this encounter  Medications   nirmatrelvir /ritonavir  (PAXLOVID ) 20 x 150 MG & 10 x 100MG  TABS    Sig: Take 3 tablets by mouth 2 (two) times daily for 5 days. (Take nirmatrelvir  150 mg two tablets twice daily for 5 days and ritonavir  100 mg one tablet twice daily for 5 days) Patient GFR is 84    Dispense:  30 tablet    Refill:  0    Patient will hold lipitor while on paxlovid     Assessment and Plan      Follow Up:  In Person prn  Signed, Michaelyn Wall, MD  02/11/2024 3:46 PM    [facility]

## 2024-02-11 NOTE — Assessment & Plan Note (Signed)
 Day 3 of COVID-19 infection with persistent symptoms. No vomiting or high fevers. Kidneys functioning well. No significant risk factors for severe disease such as lung or heart disease. Mild borderline diabetes noted, not a significant risk factor for hospitalization. - Prescribe Paxlovid  and send to pharmacy at normal dosing due to good/normal GFR - Instruct to hold Lipitor while on Paxlovid . - Advise supportive treatment with DayQuil, Nyquil, fluids, and steam. - Monitor for worsening symptoms and seek evaluation if condition deteriorates.

## 2024-02-23 ENCOUNTER — Other Ambulatory Visit: Payer: Self-pay | Admitting: Physician Assistant

## 2024-02-23 DIAGNOSIS — M51369 Other intervertebral disc degeneration, lumbar region without mention of lumbar back pain or lower extremity pain: Secondary | ICD-10-CM

## 2024-03-05 ENCOUNTER — Other Ambulatory Visit: Payer: Self-pay | Admitting: Physician Assistant

## 2024-04-06 ENCOUNTER — Other Ambulatory Visit: Payer: Self-pay | Admitting: Physician Assistant

## 2024-04-06 DIAGNOSIS — R0600 Dyspnea, unspecified: Secondary | ICD-10-CM

## 2024-04-10 ENCOUNTER — Ambulatory Visit: Payer: Self-pay

## 2024-04-10 NOTE — Telephone Encounter (Signed)
 FYI Only or Action Required?: Action required by provider: request for appointment.  Patient was last seen in primary care on 02/11/2024 by Sirivol, Mamatha, MD.  Called Nurse Triage reporting Hip Pain.  Symptoms began several days ago.  Interventions attempted: Nothing.  Symptoms are: gradually worsening.Left hip pain, hurts with ambulation.  Triage Disposition: See PCP When Office is Open (Within 3 Days)  Patient/caregiver understands and will follow disposition?: Yes     Copied from CRM #[id number]. Topic: Clinical - Red Word Triage >> Apr 10, 2024 10:39 AM Hadassah PARAS wrote: Red Word that prompted transfer to Nurse Triage: Pt is having severe pain on left hip. Pt's daughter is on the line, Yomara. Reason for Disposition  [1] MODERATE pain (e.g., interferes with normal activities, limping) AND [2] present > 3 days  Answer Assessment - Initial Assessment Questions 1. LOCATION and RADIATION: Where is the pain located? Does the pain spread (shoot) anywhere else?     Left hip 2. QUALITY: What does the pain feel like?  (e.g., sharp, dull, aching, burning)     ache 3. SEVERITY: How bad is the pain? What does it keep you from doing?   (Scale 1-10; or mild, moderate, severe)     severe 4. ONSET: When did the pain start? Does it come and go, or is it there all the time?     Few days ago 5. WORK OR EXERCISE: Has there been any recent work or exercise that involved this part of the body?      no 6. CAUSE: What do you think is causing the hip pain?      unsure 7. AGGRAVATING FACTORS: What makes the hip pain worse? (e.g., walking, climbing stairs, running)     walking 8. OTHER SYMPTOMS: Do you have any other symptoms? (e.g., back pain, pain shooting down leg,  fever, rash)     no  Protocols used: Hip Pain-A-AH

## 2024-04-11 ENCOUNTER — Ambulatory Visit (INDEPENDENT_AMBULATORY_CARE_PROVIDER_SITE_OTHER)

## 2024-04-11 VITALS — BP 120/68 | HR 80 | Temp 97.2°F | Resp 16 | Ht 67.0 in | Wt 276.0 lb

## 2024-04-11 DIAGNOSIS — M25552 Pain in left hip: Secondary | ICD-10-CM

## 2024-04-11 DIAGNOSIS — G8929 Other chronic pain: Secondary | ICD-10-CM

## 2024-04-11 MED ORDER — METHYLPREDNISOLONE 4 MG PO TBPK: ORAL_TABLET | ORAL | 1 refills | Status: AC

## 2024-04-11 MED ORDER — KETOROLAC TROMETHAMINE 60 MG/2ML IM SOLN
60.0000 mg | Freq: Once | INTRAMUSCULAR | Status: AC
  Administered 2024-04-11: 60 mg via INTRAMUSCULAR

## 2024-04-11 NOTE — Progress Notes (Unsigned)
   Acute Office Visit  Subjective:     Patient ID: Linda Acevedo, female    DOB: 1956-01-10, 68 y.o.   MRN: [id number]  Chief Complaint  Patient presents with   Hip Pain    HPI Patient is in today for ***  Review of Systems  Musculoskeletal:  Positive for joint pain (left hip pain).        Objective:    BP 120/68   Pulse 80   Temp (!) 97.2 F (36.2 C)   Resp 16   Ht 5' 7 (1.702 m)   Wt 276 lb (125.2 kg)   SpO2 96%   BMI 43.23 kg/m  {Vitals History (Optional):23777}  Physical Exam  No results found for any visits on 04/11/24.      Assessment & Plan:   Problem List Items Addressed This Visit   None   No orders of the defined types were placed in this encounter.   No follow-ups on file.  Alix LILLETTE Bach, CMA

## 2024-04-14 NOTE — Assessment & Plan Note (Signed)
 Left hip pain and suspected left hip arthritis Worsening left hip pain since Monday, rated 8/10, exacerbated by movement and weight-bearing activities. 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 Current symptoms suggest possible progression of arthritis in the left hip.   Differential diagnosis includes arthritis exacerbation. No signs of infection or acute injury. Blood work from July 21st shows normal blood sugar and kidney function, allowing for consideration of Toradol  injection. - Ordered x-ray of the left hip to assess for progression of arthritis. - Administered Toradol  injection for acute pain relief. - Prescribed prednisone taper to reduce inflammation and manage pain. - Advised use of heat or ice for symptomatic relief. - Referred to orthopedic specialist if x-ray shows severe arthritis for potential interventions such as injections or surgery.

## 2024-05-09 LAB — HM MAMMOGRAPHY

## 2024-05-14 ENCOUNTER — Other Ambulatory Visit: Payer: Self-pay

## 2024-05-14 ENCOUNTER — Ambulatory Visit (HOSPITAL_BASED_OUTPATIENT_CLINIC_OR_DEPARTMENT_OTHER)
Admission: RE | Admit: 2024-05-14 | Discharge: 2024-05-14 | Disposition: A | Discharge status: Home / Self Care | Source: Ambulatory Visit

## 2024-05-14 DIAGNOSIS — G8929 Other chronic pain: Secondary | ICD-10-CM | POA: Diagnosis not present

## 2024-05-14 DIAGNOSIS — M25552 Pain in left hip: Secondary | ICD-10-CM | POA: Diagnosis not present

## 2024-05-30 ENCOUNTER — Ambulatory Visit: Payer: Self-pay

## 2024-05-30 DIAGNOSIS — M1612 Unilateral primary osteoarthritis, left hip: Secondary | ICD-10-CM

## 2024-05-30 DIAGNOSIS — G8929 Other chronic pain: Secondary | ICD-10-CM

## 2024-06-05 ENCOUNTER — Other Ambulatory Visit: Payer: Self-pay | Admitting: Physician Assistant

## 2024-06-05 DIAGNOSIS — M51369 Other intervertebral disc degeneration, lumbar region without mention of lumbar back pain or lower extremity pain: Secondary | ICD-10-CM

## 2024-06-13 ENCOUNTER — Ambulatory Visit: Admitting: Physician Assistant

## 2024-06-19 ENCOUNTER — Telehealth: Payer: Self-pay

## 2024-06-19 NOTE — Telephone Encounter (Signed)
 Copied from CRM #[id number]. Topic: Referral - Question >> Jun 19, 2024 10:21 AM Burnard DEL wrote: Reason for CRM: Patients daughter called in stating that emergertho told her that referral wasn't received by the office. She stated that they told her it may need to be faxed to their office instead of being sent electronically. She said that her mom is in a lot of pain with her hip and she's trying to get her into see them as soon as possible. They stated for STAT to be placed on the referral and a contact number for the office.  Fx#:[phone]

## 2024-07-04 ENCOUNTER — Ambulatory Visit: Admitting: Physician Assistant

## 2024-07-11 ENCOUNTER — Ambulatory Visit: Admitting: Physician Assistant

## 2024-10-07 ENCOUNTER — Ambulatory Visit: Admitting: Family Medicine
